# Patient Record
Sex: Female | Born: 1990 | Race: Asian | Hispanic: No | Marital: Single | Smoking: Never smoker
Health system: Southern US, Community
[De-identification: ages and names within clinical notes are randomized; demographics above are authoritative.]

## PROBLEM LIST (undated history)

## (undated) ENCOUNTER — Inpatient Hospital Stay (HOSPITAL_COMMUNITY): Payer: Self-pay

## (undated) DIAGNOSIS — Z9289 Personal history of other medical treatment: Secondary | ICD-10-CM

## (undated) HISTORY — PX: WISDOM TOOTH EXTRACTION: SHX21

---
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
## 2011-08-21 ENCOUNTER — Encounter (INDEPENDENT_AMBULATORY_CARE_PROVIDER_SITE_OTHER): Payer: Medicaid Other

## 2011-08-21 DIAGNOSIS — Z331 Pregnant state, incidental: Secondary | ICD-10-CM

## 2011-08-26 LAB — HEPATITIS B SURFACE ANTIGEN: Hepatitis B Surface Ag: NEGATIVE

## 2011-08-26 LAB — ABO/RH: RH Type: POSITIVE

## 2011-08-26 LAB — RUBELLA ANTIBODY, IGM: Rubella: IMMUNE

## 2011-08-26 LAB — ANTIBODY SCREEN: Antibody Screen: NEGATIVE

## 2011-08-26 LAB — HIV ANTIBODY (ROUTINE TESTING W REFLEX): HIV: NONREACTIVE

## 2011-09-02 ENCOUNTER — Encounter: Payer: Medicaid Other | Admitting: Obstetrics and Gynecology

## 2011-09-02 ENCOUNTER — Encounter (INDEPENDENT_AMBULATORY_CARE_PROVIDER_SITE_OTHER): Payer: Medicaid Other

## 2011-09-02 DIAGNOSIS — O0930 Supervision of pregnancy with insufficient antenatal care, unspecified trimester: Secondary | ICD-10-CM

## 2011-09-02 DIAGNOSIS — Z331 Pregnant state, incidental: Secondary | ICD-10-CM

## 2011-09-02 DIAGNOSIS — N898 Other specified noninflammatory disorders of vagina: Secondary | ICD-10-CM

## 2011-09-02 DIAGNOSIS — O26849 Uterine size-date discrepancy, unspecified trimester: Secondary | ICD-10-CM

## 2011-09-12 ENCOUNTER — Encounter (INDEPENDENT_AMBULATORY_CARE_PROVIDER_SITE_OTHER): Payer: Medicaid Other | Admitting: Obstetrics and Gynecology

## 2011-09-12 ENCOUNTER — Other Ambulatory Visit: Payer: Medicaid Other

## 2011-09-12 ENCOUNTER — Other Ambulatory Visit: Payer: Self-pay | Admitting: Obstetrics and Gynecology

## 2011-09-12 DIAGNOSIS — Z331 Pregnant state, incidental: Secondary | ICD-10-CM

## 2011-09-12 DIAGNOSIS — D280 Benign neoplasm of vulva: Secondary | ICD-10-CM

## 2011-09-12 DIAGNOSIS — N72 Inflammatory disease of cervix uteri: Secondary | ICD-10-CM

## 2011-09-12 DIAGNOSIS — O0930 Supervision of pregnancy with insufficient antenatal care, unspecified trimester: Secondary | ICD-10-CM

## 2011-09-13 ENCOUNTER — Inpatient Hospital Stay (HOSPITAL_COMMUNITY)
Admission: AD | Admit: 2011-09-13 | Discharge: 2011-09-16 | DRG: 775 | Disposition: A | Discharge status: Home / Self Care | Payer: Medicaid Other | Source: Ambulatory Visit | Attending: Obstetrics and Gynecology | Admitting: Obstetrics and Gynecology

## 2011-09-13 ENCOUNTER — Encounter (HOSPITAL_COMMUNITY): Payer: Self-pay | Admitting: *Deleted

## 2011-09-13 DIAGNOSIS — Z3400 Encounter for supervision of normal first pregnancy, unspecified trimester: Secondary | ICD-10-CM

## 2011-09-13 DIAGNOSIS — D649 Anemia, unspecified: Secondary | ICD-10-CM | POA: Diagnosis not present

## 2011-09-13 LAB — URINALYSIS, ROUTINE W REFLEX MICROSCOPIC
Glucose, UA: NEGATIVE mg/dL
Ketones, ur: >80 mg/dL — AB
Nitrite: NEGATIVE
Protein, ur: 30 mg/dL — AB
Specific Gravity, Urine: 1.025 (ref 1.005–1.030)
Urobilinogen, UA: 1 mg/dL (ref 0.0–1.0)
pH: 6.5 (ref 5.0–8.0)

## 2011-09-13 LAB — CBC
Hemoglobin: 13 g/dL (ref 12.0–15.0)
MCHC: 33.4 g/dL (ref 30.0–36.0)
RBC: 4.61 MIL/uL (ref 3.87–5.11)
WBC: 18.9 10*3/uL — ABNORMAL HIGH (ref 4.0–10.5)

## 2011-09-13 LAB — URINE MICROSCOPIC-ADD ON

## 2011-09-13 MED ORDER — FLEET ENEMA 7-19 GM/118ML RE ENEM: 1.0000 | ENEMA | RECTAL | Status: DC | PRN

## 2011-09-13 MED ORDER — OXYTOCIN 20 UNITS IN LACTATED RINGERS INFUSION - SIMPLE
125.0000 mL/h | Freq: Once | INTRAVENOUS | Status: AC
  Administered 2011-09-14: 125 mL/h via INTRAVENOUS

## 2011-09-13 MED ORDER — LACTATED RINGERS IV SOLN
500.0000 mL | INTRAVENOUS | Status: DC | PRN
  Administered 2011-09-14: 500 mL via INTRAVENOUS

## 2011-09-13 MED ORDER — OXYTOCIN BOLUS FROM INFUSION
500.0000 mL | Freq: Once | INTRAVENOUS | Status: DC
  Filled 2011-09-13: qty 1000
  Filled 2011-09-13: qty 500

## 2011-09-13 MED ORDER — BUTORPHANOL TARTRATE 2 MG/ML IJ SOLN
2.0000 mg | INTRAMUSCULAR | Status: DC | PRN
  Administered 2011-09-13 – 2011-09-14 (×3): 2 mg via INTRAVENOUS
  Filled 2011-09-13 (×3): qty 1

## 2011-09-13 MED ORDER — ONDANSETRON HCL 4 MG/2ML IJ SOLN: 4.0000 mg | Freq: Four times a day (QID) | INTRAMUSCULAR | Status: DC | PRN

## 2011-09-13 MED ORDER — PENICILLIN G POTASSIUM 5000000 UNITS IJ SOLR
5.0000 10*6.[IU] | Freq: Once | INTRAVENOUS | Status: AC
  Administered 2011-09-13: 5 10*6.[IU] via INTRAVENOUS
  Filled 2011-09-13: qty 5

## 2011-09-13 MED ORDER — LIDOCAINE HCL (PF) 1 % IJ SOLN
30.0000 mL | INTRAMUSCULAR | Status: DC | PRN
  Administered 2011-09-14: 30 mL via SUBCUTANEOUS
  Filled 2011-09-13: qty 30

## 2011-09-13 MED ORDER — ACETAMINOPHEN 325 MG PO TABS: 650.0000 mg | ORAL_TABLET | ORAL | Status: DC | PRN

## 2011-09-13 MED ORDER — OXYCODONE-ACETAMINOPHEN 5-325 MG PO TABS: 1.0000 | ORAL_TABLET | ORAL | Status: DC | PRN

## 2011-09-13 MED ORDER — IBUPROFEN 600 MG PO TABS: 600.0000 mg | ORAL_TABLET | Freq: Four times a day (QID) | ORAL | Status: DC | PRN

## 2011-09-13 MED ORDER — LACTATED RINGERS IV SOLN
INTRAVENOUS | Status: DC
  Administered 2011-09-13 – 2011-09-14 (×2): via INTRAVENOUS

## 2011-09-13 MED ORDER — PENICILLIN G POTASSIUM 5000000 UNITS IJ SOLR
2.5000 10*6.[IU] | INTRAVENOUS | Status: DC
  Administered 2011-09-14 (×2): 2.5 10*6.[IU] via INTRAVENOUS
  Filled 2011-09-13 (×7): qty 2.5

## 2011-09-13 MED ORDER — CITRIC ACID-SODIUM CITRATE 334-500 MG/5ML PO SOLN: 30.0000 mL | ORAL | Status: DC | PRN

## 2011-09-13 NOTE — MAU Note (Signed)
Pt states has been contracting for last 24 hours and it has gotten worse and has a little spotting sometimes when she wipes

## 2011-09-13 NOTE — H&P (Signed)
Deborah Craig is a 21 y.o. female presenting for c/o of "contractions since 0400 stronger at 1500 feels like cramping some red mucus no water no drippy bleeding, with +FM, no problems with pg no "cultures yet for GBS, [place] was normal will repeat next week, only gained 18 # with the pregnancy" Pg significant for: no prenatal records found in computer denies complications. Started care at 33 weeks per 1st prenatal lab work as no chart available and pt uncertain, had no prior [facility]. Pg significant for: Late PNC 33 4/7 weeks History OB History    Grav Para Term Preterm Abortions TAB SAB Ect Mult Living   1              History reviewed. No pertinent past medical history. No past surgical history on file. Family History: family history includes Diabetes in her mother and Hypertension in her mother. Social History:  reports that she has never smoked. She has never used smokeless tobacco. She reports that she does not drink alcohol or use illicit drugs.  ROS No surgery, No med problems NKA Meds: PNV Dilation: 3 Effacement (%): 100 Station: 0 Exam by:: Lavera Guise CNM Blood pressure 119/66, pulse 100, resp. rate 20. Exam Physical Exam calm quiet, no distress, lungs clear AP RRR, abd soft gravid, Bowel sounds active,  nt, no edema lower legs, EGBUS WNL fundal height 35 cm. Prenatal labs: ABO, Rh:  O pos Antibody:  neg Rubella:  Immune RPR:   NR HBsAg:   Neg HIV:   NR GBS:   pending at [facility] GC/CHL pending at [facility]  Assessment/Plan: 36 5/7 week IUP P wil reassess vag for cervical change, po fluids now, GBS, GC/CHL urine and ua to lab. Will admit now after observation collaboration with Dr. Pennie Rushing. Deborah Craig 09/13/2011, 7:42 PM

## 2011-09-13 NOTE — Progress Notes (Signed)
GBS obtained. Pt tol well.

## 2011-09-14 ENCOUNTER — Encounter (HOSPITAL_COMMUNITY): Payer: Self-pay | Admitting: *Deleted

## 2011-09-14 DIAGNOSIS — Z3400 Encounter for supervision of normal first pregnancy, unspecified trimester: Secondary | ICD-10-CM

## 2011-09-14 LAB — RPR: RPR Ser Ql: NONREACTIVE

## 2011-09-14 MED ORDER — PHENYLEPHRINE 40 MCG/ML (10ML) SYRINGE FOR IV PUSH (FOR BLOOD PRESSURE SUPPORT): 80.0000 ug | PREFILLED_SYRINGE | INTRAVENOUS | Status: DC | PRN

## 2011-09-14 MED ORDER — ONDANSETRON HCL 4 MG PO TABS: 4.0000 mg | ORAL_TABLET | ORAL | Status: DC | PRN

## 2011-09-14 MED ORDER — LANOLIN HYDROUS EX OINT: TOPICAL_OINTMENT | CUTANEOUS | Status: DC | PRN

## 2011-09-14 MED ORDER — ONDANSETRON HCL 4 MG/2ML IJ SOLN: 4.0000 mg | INTRAMUSCULAR | Status: DC | PRN

## 2011-09-14 MED ORDER — OXYCODONE-ACETAMINOPHEN 5-325 MG PO TABS: 1.0000 | ORAL_TABLET | ORAL | Status: DC | PRN

## 2011-09-14 MED ORDER — PRENATAL MULTIVITAMIN CH
1.0000 | ORAL_TABLET | Freq: Every day | ORAL | Status: DC
  Filled 2011-09-14: qty 1

## 2011-09-14 MED ORDER — ZOLPIDEM TARTRATE 5 MG PO TABS: 5.0000 mg | ORAL_TABLET | Freq: Every evening | ORAL | Status: DC | PRN

## 2011-09-14 MED ORDER — IBUPROFEN 600 MG PO TABS
600.0000 mg | ORAL_TABLET | Freq: Four times a day (QID) | ORAL | Status: DC
  Administered 2011-09-14: 600 mg via ORAL
  Filled 2011-09-14: qty 1

## 2011-09-14 MED ORDER — PHENYLEPHRINE 40 MCG/ML (10ML) SYRINGE FOR IV PUSH (FOR BLOOD PRESSURE SUPPORT)
80.0000 ug | PREFILLED_SYRINGE | INTRAVENOUS | Status: DC | PRN
  Filled 2011-09-14: qty 5

## 2011-09-14 MED ORDER — IBUPROFEN 600 MG PO TABS
600.0000 mg | ORAL_TABLET | Freq: Four times a day (QID) | ORAL | Status: DC
  Administered 2011-09-14 – 2011-09-16 (×6): 600 mg via ORAL
  Filled 2011-09-14 (×5): qty 1

## 2011-09-14 MED ORDER — DIBUCAINE 1 % RE OINT: 1.0000 "application " | TOPICAL_OINTMENT | RECTAL | Status: DC | PRN

## 2011-09-14 MED ORDER — BENZOCAINE-MENTHOL 20-0.5 % EX AERO: 1.0000 "application " | INHALATION_SPRAY | CUTANEOUS | Status: DC | PRN

## 2011-09-14 MED ORDER — DIPHENHYDRAMINE HCL 25 MG PO CAPS: 25.0000 mg | ORAL_CAPSULE | Freq: Four times a day (QID) | ORAL | Status: DC | PRN

## 2011-09-14 MED ORDER — LACTATED RINGERS IV SOLN: INTRAVENOUS | Status: DC

## 2011-09-14 MED ORDER — EPHEDRINE 5 MG/ML INJ: 10.0000 mg | INTRAVENOUS | Status: DC | PRN

## 2011-09-14 MED ORDER — WITCH HAZEL-GLYCERIN EX PADS: 1.0000 "application " | MEDICATED_PAD | CUTANEOUS | Status: DC | PRN

## 2011-09-14 MED ORDER — PRENATAL MULTIVITAMIN CH
1.0000 | ORAL_TABLET | Freq: Every day | ORAL | Status: DC
  Administered 2011-09-15 – 2011-09-16 (×2): 1 via ORAL
  Filled 2011-09-14: qty 1

## 2011-09-14 MED ORDER — BENZOCAINE-MENTHOL 20-0.5 % EX AERO
INHALATION_SPRAY | CUTANEOUS | Status: AC
  Filled 2011-09-14: qty 56

## 2011-09-14 MED ORDER — SENNOSIDES-DOCUSATE SODIUM 8.6-50 MG PO TABS
2.0000 | ORAL_TABLET | Freq: Every day | ORAL | Status: DC
  Administered 2011-09-14 – 2011-09-15 (×2): 2 via ORAL

## 2011-09-14 MED ORDER — TETANUS-DIPHTH-ACELL PERTUSSIS 5-2.5-18.5 LF-MCG/0.5 IM SUSP
0.5000 mL | Freq: Once | INTRAMUSCULAR | Status: AC
  Administered 2011-09-15: 0.5 mL via INTRAMUSCULAR
  Filled 2011-09-14: qty 0.5

## 2011-09-14 MED ORDER — DIPHENHYDRAMINE HCL 50 MG/ML IJ SOLN: 12.5000 mg | INTRAMUSCULAR | Status: DC | PRN

## 2011-09-14 MED ORDER — LACTATED RINGERS IV SOLN: 500.0000 mL | Freq: Once | INTRAVENOUS | Status: DC

## 2011-09-14 MED ORDER — BENZOCAINE-MENTHOL 20-0.5 % EX AERO
1.0000 "application " | INHALATION_SPRAY | CUTANEOUS | Status: DC | PRN
  Administered 2011-09-14: 1 via TOPICAL

## 2011-09-14 MED ORDER — SENNOSIDES-DOCUSATE SODIUM 8.6-50 MG PO TABS: 2.0000 | ORAL_TABLET | Freq: Every day | ORAL | Status: DC

## 2011-09-14 MED ORDER — SIMETHICONE 80 MG PO CHEW: 80.0000 mg | CHEWABLE_TABLET | ORAL | Status: DC | PRN

## 2011-09-14 MED ORDER — EPHEDRINE 5 MG/ML INJ
10.0000 mg | INTRAVENOUS | Status: DC | PRN
  Filled 2011-09-14: qty 4

## 2011-09-14 MED ORDER — FENTANYL 2.5 MCG/ML BUPIVACAINE 1/10 % EPIDURAL INFUSION (WH - ANES)
14.0000 mL/h | INTRAMUSCULAR | Status: DC
  Filled 2011-09-14: qty 60

## 2011-09-14 MED ORDER — MEDROXYPROGESTERONE ACETATE 150 MG/ML IM SUSP: 150.0000 mg | INTRAMUSCULAR | Status: DC | PRN

## 2011-09-14 MED ORDER — TETANUS-DIPHTH-ACELL PERTUSSIS 5-2.5-18.5 LF-MCG/0.5 IM SUSP: 0.5000 mL | Freq: Once | INTRAMUSCULAR | Status: DC

## 2011-09-14 NOTE — Progress Notes (Signed)
Subjective: RN called me to bedside to assess pt secondary to increased bloody show, and pain medicine wearing off and pt appearing more uncomfortable.  Objective: BP 107/64  Pulse 81  Temp(Src) 98.3 F (36.8 C) (Oral)  Resp 18  Ht 5\' 4"  (1.626 m)  Wt 67.132 kg (148 lb)  BMI 25.40 kg/m2      FHT:  FHR: 140 bpm, variability: minimal ,  accelerations:  Present,  decelerations:  Absent UC:   irregular, every 3-5 minutes SVE:   Dilation: 6 Effacement (%): 100 Station: 0 Exam by:: h Sharlyne Koeneman,cnm Small amt of show;  Labs: Lab Results  Component Value Date   WBC 18.9* 09/13/2011   HGB 13.0 09/13/2011   HCT 38.9 09/13/2011   MCV 84.4 09/13/2011   PLT 327 09/13/2011    Assessment / Plan: 1.  36.6  2.  early active labor  3.  GBS unknown, but receiving PCN  Labor: early active Preeclampsia:  no signs or symptoms of toxicity Fetal Wellbeing:  Category I Pain Control:  has received 2 doses of IV stadol I/D:  n/a Anticipated MOD:  NSVD 1.  Continue current POC  2.  Support as needed  3. C/w MD prn Elie Leppo H 09/14/2011, 3:52 AM

## 2011-09-14 NOTE — Progress Notes (Signed)
Subjective: Pt resting on her Lt side and her mother is at bedside resting. Pt received 2mg  Stadol per MAR at 2140, and RN reports pt able to sleep for the most part.  Pt verbally states at present that her ctxs are the same frequency and intensity since admitted to birthing suites.  Objective: BP 137/82  Pulse 81  Temp(Src) 98.5 F (36.9 C) (Oral)  Resp 16  Ht 5\' 4"  (1.626 m)  Wt 67.132 kg (148 lb)  BMI 25.40 kg/m2      FHT:  FHR: 140 bpm, variability: moderate,  accelerations:  Present,  decelerations:  Absent UC:   irregular, every 2-8 minutes SVE:   Dilation: 4.5 Effacement (%): 100 Station: -1;0 Exam by:: [employer]CNM Gen:  Pt in NAD, no labored breathing or grimace w/ ctxs Labs: Lab Results  Component Value Date   WBC 18.9* 09/13/2011   HGB 13.0 09/13/2011   HCT 38.9 09/13/2011   MCV 84.4 09/13/2011   PLT 327 09/13/2011    Assessment / Plan: 1.  36.6  2. irregular ctx pattern  3.  early vs prodromal labor  4.  GBS sent, but receiving PCN at present  Preeclampsia:  no signs or symptoms of toxicity Fetal Wellbeing:  Category I Pain Control:  received Stadol x1 I/D:  n/a Anticipated MOD:  NSVD 1.  Will CTO for spontaneous labor progression as the night proceeds  2.  Do not plan to augment secondary to preterm gestation  3.  C/w MD prn. Deborah Craig H 09/14/2011, 12:26 AM

## 2011-09-14 NOTE — Progress Notes (Signed)
Patient ID: Italia Wolfert, female   DOB: Dec 10, 1990, 20 y.o.   MRN: [id number] Delivery Note Called to see pt due to involuntary pushing at 09:45am.  SVE C/C/+3.  Pt pushed effectively with UCs.   At 10:02 AM a viable female was delivered via Vaginal, Spontaneous Delivery (Presentation: ; Occiput Anterior).  APGAR: 9, 9; weight 6 lb 8.2 oz (2954 g).   Placenta status: Intact, Spontaneous.  Cord: 3 vessels with the following complications: None.  Cord pH: N/A  Anesthesia: None  Episiotomy: None Lacerations: 2nd degree;Perineal Suture Repair: 3.0 vicryl rapide Est. Blood Loss (mL): 350  Mom to postpartum.  Baby to nursery-stable.  Eleaner Dibartolo O. 09/14/2011, 2:30 PM

## 2011-09-14 NOTE — Progress Notes (Signed)
Deborah Craig is a 21 y.o. G1P0 at [redacted]w[redacted]d admitted for active labor  Subjective: Pt recvd Stadol approx 1hr ago and states discomfort has improved at present.  Pt dozing between contractions and quiet in bed.    Objective: BP 143/81  Pulse 88  Temp(Src) 97.5 F (36.4 C) (Oral)  Resp 20  Ht 5\' 4"  (1.626 m)  Wt 67.132 kg (148 lb)  BMI 25.40 kg/m2     FHT:  FHR: 155 bpm, variability: moderate,  accelerations:  Present,  decelerations:  Absent UC:   regular, every 3-4 minutes SVE:   Dilation: 8 Effacement (%): 100 Station: 0 Exam by:: Jakaila Norment cnm AROM performed with mod clear fluid obtained.   Labs: Lab Results  Component Value Date   WBC 18.9* 09/13/2011   HGB 13.0 09/13/2011   HCT 38.9 09/13/2011   MCV 84.4 09/13/2011   PLT 327 09/13/2011    Assessment / Plan: Spontaneous labor, progressing normally  Labor: Progressing normally Preeclampsia:  no signs or symptoms of toxicity Fetal Wellbeing:  Category I Pain Control:  Labor support without medications I/D:  n/a Anticipated MOD:  NSVD  Continue current plan of care.    Roger Fasnacht O. 09/14/2011, 8:41 AM

## 2011-09-14 NOTE — Progress Notes (Signed)
Subjective: Pt requesting additional pain medicine now.    Objective: BP 114/70  Pulse 92  Temp(Src) 98.3 F (36.8 C) (Oral)  Resp 18  Ht 5\' 4"  (1.626 m)  Wt 67.132 kg (148 lb)  BMI 25.40 kg/m2      FHT:  FHR: 140 bpm, variability: moderate,  accelerations:  Present,  decelerations:  Absent UC:   irregular, every 2-6 minutes SVE:   Dilation: 5.5 Effacement (%): 100 Station: -1 Exam by:: H. Ronnell Clinger, CNM Small amt of bloody show on pt's gown, perineum, and pillow-case which was between her legs cx primarily on pt's Rt; membranes bulging Labs: Lab Results  Component Value Date   WBC 18.9* 09/13/2011   HGB 13.0 09/13/2011   HCT 38.9 09/13/2011   MCV 84.4 09/13/2011   PLT 327 09/13/2011    Assessment / Plan: 1.  36.6  2.  cervical change with irregular ctxs  3.  spontaneous labor  4.  due 2nd dose of PCN now  Preeclampsia:  no signs or symptoms of toxicity Fetal Wellbeing:  Category I Pain Control:  receiving 2nd dose of stadol now I/D:  n/a Anticipated MOD:  NSVD 1.  Support as needed  2.  CTO for labor progression and c/w MD if labor doesn't continue to progress Ayad Nieman H 09/14/2011, 2:07 AM

## 2011-09-15 DIAGNOSIS — D649 Anemia, unspecified: Secondary | ICD-10-CM | POA: Diagnosis not present

## 2011-09-15 LAB — DIFFERENTIAL
Basophils Absolute: 0 10*3/uL (ref 0.0–0.1)
Basophils Relative: 0 % (ref 0–1)
Eosinophils Absolute: 0.2 10*3/uL (ref 0.0–0.7)
Lymphocytes Relative: 19 % (ref 12–46)
Lymphs Abs: 3.5 10*3/uL (ref 0.7–4.0)
Monocytes Absolute: 2 10*3/uL — ABNORMAL HIGH (ref 0.1–1.0)
Neutro Abs: 12.6 10*3/uL — ABNORMAL HIGH (ref 1.7–7.7)

## 2011-09-15 LAB — CBC
HCT: 31.7 % — ABNORMAL LOW (ref 36.0–46.0)
Hemoglobin: 10.3 g/dL — ABNORMAL LOW (ref 12.0–15.0)
MCH: 27.7 pg (ref 26.0–34.0)
MCHC: 32.2 g/dL (ref 30.0–36.0)
MCV: 86.1 fL (ref 78.0–100.0)
RDW: 16.4 % — ABNORMAL HIGH (ref 11.5–15.5)

## 2011-09-15 NOTE — Progress Notes (Signed)
PSYCHOSOCIAL ASSESSMENT ~ MATERNAL/CHILD Name:  Deborah Craig        Age: 21 years    Referral Date: 09/14/2011   Reason/Source:Late PNC/CN I. FAMILY/HOME ENVIRONMENT A. Child's Legal Guardian Parent(s)     Name: Ruqayyah Lute DOB: 1990-08-14   Age: 58 Address: [address], [place], [place] [address]  B. Other Household Members/Support Persons   Maternal grandmother   Maternal aunts/uncles                     C.   Other Support: Extended family and friends  II. PSYCHOSOCIAL DATA A. Information Source X Patient Interview X Family Interview           B. Event organiser X Medicaid-[employer]- will apply      X WIC   C. Cultural and Environment Information/Cultural Issues Impacting Care: N/A III. STRENGTHS X Supportive family/friends   X Adequate Resources  X Compliance with medical plan  X Home prepared for Child (including basic supplies)                 X Other:  Extended family support IV. RISK FACTORS AND CURRENT PROBLEMS        X No Problems Noted                        V. SOCIAL WORK ASSESSMENT Met with MOB and baby at bedside.  Maternal grandmother and aunt were present with mom's permission.  MOB was late to prenatal care due to Medicaid not becoming active until later in pregnancy.  MOB received some WIC services in [place] [facility] due to the [place] office reportedly having been full.  MOB is the second oldest of five children and her siblings are excited to meet the new baby.  MOB has lots of extended family support and good role models for breastfeeding.  She is not working and is supported by her family. She aspires to eventually return to [employer] to pursue nursing.  MOB reports recovery well and feels good about her ability to parent her newborn.  She expressed no concerns or needs at this time.  VI. SOCIAL WORK PLAN X No Further Intervention Required/No Barriers to Discharge  Staci Acosta, LCSW 09/15/2011, 5:10  pm

## 2011-09-15 NOTE — Progress Notes (Signed)
Post Partum Day 1  Subjective: no complaints, up ad lib, voiding and tolerating PO  Objective: Blood pressure 91/49, pulse 79, temperature 98 F (36.7 C), temperature source Oral, resp. rate 18, height 5\' 4"  (1.626 m), weight 67.132 kg (148 lb), SpO2 98.00%, unknown if currently breastfeeding.  Physical Exam:  General: alert, cooperative and no distress Lochia: appropriate Uterine Fundus: firm Incision: NA DVT Evaluation: Negative Homan's sign. No significant calf/ankle edema.   Basename 09/15/11 0530 09/13/11 2125  HGB 10.3* 13.0  HCT 31.7* 38.9    Assessment/Plan: Plan for discharge tomorrow, Breastfeeding and Contraception Depo   LOS: 2 days   Dorathy Kinsman 09/15/2011, 9:07 AM

## 2011-09-16 LAB — CULTURE, BETA STREP (GROUP B ONLY)

## 2011-09-16 LAB — CBC
Hemoglobin: 10.3 g/dL — ABNORMAL LOW (ref 12.0–15.0)
MCH: 27.3 pg (ref 26.0–34.0)
MCHC: 31.6 g/dL (ref 30.0–36.0)
RDW: 16.4 % — ABNORMAL HIGH (ref 11.5–15.5)

## 2011-09-16 LAB — GC/CHLAMYDIA PROBE AMP, URINE
Chlamydia, Swab/Urine, PCR: POSITIVE — AB
GC Probe Amp, Urine: NEGATIVE

## 2011-09-16 LAB — HERPES SIMPLEX VIRUS CULTURE: Organism ID, Bacteria: NOT DETECTED

## 2011-09-16 MED ORDER — AZITHROMYCIN 500 MG PO TABS
1000.0000 mg | ORAL_TABLET | Freq: Once | ORAL | Status: AC
  Administered 2011-09-16: 1000 mg via ORAL
  Filled 2011-09-16: qty 2

## 2011-09-16 MED ORDER — IBUPROFEN 600 MG PO TABS: 600.0000 mg | ORAL_TABLET | Freq: Four times a day (QID) | ORAL | Status: AC

## 2011-09-16 MED ORDER — MEDROXYPROGESTERONE ACETATE 150 MG/ML IM SUSP
150.0000 mg | Freq: Once | INTRAMUSCULAR | Status: AC
  Administered 2011-09-16: 150 mg via INTRAMUSCULAR
  Filled 2011-09-16: qty 1

## 2011-09-16 NOTE — Discharge Summary (Addendum)
Physician Discharge Summary  Patient ID: Deborah Craig MRN: [id number] DOB/AGE: 08-14-1990 21 y.o.  Admit date: 09/13/2011 Discharge date: 09/16/2011   Obstetric Discharge Summary Reason for Admission: onset of labor Prenatal Procedures: ultrasound Intrapartum Procedures: spontaneous vaginal delivery Postpartum Procedures: none Complications-Operative and Postpartum: 2nd degree perineal laceration  Temp:  [97.9 F (36.6 C)-98.1 F (36.7 C)] 98 F (36.7 C) (04/08 0525) Pulse Rate:  [76-80] 76  (04/08 0525) Resp:  [18-20] 18  (04/08 0525) BP: (91-98)/(42-61) 98/61 mmHg (04/08 0525) Hemoglobin  Date Value Range Status  09/16/2011 10.3* 12.0-15.0 (g/dL) Final     HCT  Date Value Range Status  09/16/2011 32.6* 36.0-46.0 (%) Final    Hospital Course:  Hospital Course: Admitted in labor. Unknown GBS. Progressed to fully dilated. Delivery was performed by Elsie Ra, CNM without difficulty. Patient and baby tolerated the procedure without difficulty, with a 2nd degree laceration noted. Infant to FTN. Mother and infant then had an uncomplicated postpartum course, with breastfeeding going well. Mom's physical exam was WNL, and she was discharged home in stable condition. Contraception plan was depo provera.  She received adequate benefit from po pain medications.  Discharge Diagnoses: Term Pregnancy-delivered  Discharge Information: Date: 09/16/2011 Activity: nothing in vagina x 6 weeks Diet: routine Medications:  Medication List  As of 09/16/2011  9:06 AM   START taking these medications         ibuprofen 600 MG tablet   Commonly known as: ADVIL,MOTRIN   Take 1 tablet (600 mg total) by mouth every 6 (six) hours.         CONTINUE taking these medications         prenatal multivitamin Tabs          Where to get your medications    These are the prescriptions that you need to pick up.   You may get these medications from any pharmacy.         ibuprofen 600 MG tablet            Condition: stable Instructions: refer to practice specific booklet Discharge to: home Follow-up Information    Follow up with [facility]. Call in 6 weeks.         Newborn Data: Live born  Information for the patient's newborn:  Moro, Girl Abella [[id number]]  female ; APGAR 9,9 ; weight 6lbs. 8.2 oz.    Kizzie Fantasia CORI 09/16/2011, 9:06 AM      Signed: Kizzie Fantasia CORI 09/16/2011, 9:05 AM  Addendum +chlamydia discussed needs test of cure at pp visit. Lavera Guise, CNM

## 2011-09-16 NOTE — Discharge Instructions (Signed)
Vaginal Delivery Care After  Change your pad on each trip to the bathroom.   Wipe gently with toilet paper during your hospital stay. Always wipe from front to back. A spray bottle with warm tap water could also be used or a towelette if available.   Place your soiled pad and toilet paper in a bathroom wastebasket with a plastic bag liner.   During your hospital stay, save any clots. If you pass a clot while on the toilet, do not flush it. Also, if your vaginal flow seems excessive to you, notify nursing personnel.   The first time you get out of bed after delivery, wait for assistance from a nurse. Do not get up alone at any time if you feel weak or dizzy.   Bend and extend your ankles forcefully so that you feel the calves of your legs get hard. Do this 6 times every hour when you are in bed and awake.   Do not sit with one foot under you, dangle your legs over the edge of the bed, or maintain a position that hinders the circulation in your legs.   Many women experience after pains for 2 to 3 days after delivery. These after pains are mild uterine contractions. Ask the nurse for a pain medication if you need something for this. Sometimes breastfeeding stimulates after pains; if you find this to be true, ask for the medication  -  hour before the next feeding.   For you and your infant's protection, do not go beyond the door(s) of the obstetric unit. Do not carry your baby in your arms in the hallway. When taking your baby to and from your room, put your baby in the bassinet and push the bassinet.   Mothers may have their babies in their room as much as they desire.   Breastfeeding BENEFITS OF BREASTFEEDING For the baby  The first milk (colostrum) helps the baby's digestive system function better.   There are antibodies from the mother in the milk that help the baby fight off infections.   The baby has a lower incidence of asthma, allergies, and SIDS (sudden infant death syndrome).     The nutrients in breast milk are better than formulas for the baby and helps the baby's brain grow better.   Babies who breastfeed have less gas, colic, and constipation.  For the mother  Breastfeeding helps develop a very special bond between mother and baby.   It is more convenient, always available at the correct temperature and cheaper than formula feeding.   It burns calories in the mother and helps with losing weight that was gained during pregnancy.   It makes the uterus contract back down to normal size faster and slows bleeding following delivery.   Breastfeeding mothers have a lower risk of developing breast cancer.  NURSE FREQUENTLY  A healthy, full-term baby may breastfeed as often as every hour or space his or her feedings to every 3 hours.   How often to nurse will vary from baby to baby. Watch your baby for signs of hunger, not the clock.   Nurse as often as the baby requests, or when you feel the need to reduce the fullness of your breasts.   Awaken the baby if it has been 3 to 4 hours since the last feeding.   Frequent feeding will help the mother make more milk and will prevent problems like sore nipples and engorgement of the breasts.  BABY'S POSITION AT THE BREAST    Whether lying down or sitting, be sure that the baby's tummy is facing your tummy.   Support the breast with 4 fingers underneath the breast and the thumb above. Make sure your fingers are well away from the nipple and baby's mouth.   Stroke the baby's lips and cheek closest to the breast gently with your finger or nipple.   When the baby's mouth is open wide enough, place all of your nipple and as much of the dark area around the nipple as possible into your baby's mouth.   Pull the baby in close so the tip of the nose and the baby's cheeks touch the breast during the feeding.  FEEDINGS  The length of each feeding varies from baby to baby and from feeding to feeding.   The baby must suck  about 2 to 3 minutes for your milk to get to him or her. This is called a "let down." For this reason, allow the baby to feed on each breast as long as he or she wants. Your baby will end the feeding when he or she has received the right balance of nutrients.   To break the suction, put your finger into the corner of the baby's mouth and slide it between his or her gums before removing your breast from his or her mouth. This will help prevent sore nipples.  REDUCING BREAST ENGORGEMENT  In the first week after your baby is born, you may experience signs of breast engorgement. When breasts are engorged, they feel heavy, warm, full, and may be tender to the touch. You can reduce engorgement if you:   Nurse frequently, every 2 to 3 hours. Mothers who breastfeed early and often have fewer problems with engorgement.   Place light ice packs on your breasts between feedings. This reduces swelling. Wrap the ice packs in a lightweight towel to protect your skin.   Apply moist hot packs to your breast for 5 to 10 minutes before each feeding. This increases circulation and helps the milk flow.   Gently massage your breast before and during the feeding.   Make sure that the baby empties at least one breast at every feeding before switching sides.   Use a breast pump to empty the breasts if your baby is sleepy or not nursing well. You may also want to pump if you are returning to work or or you feel you are getting engorged.   Avoid bottle feeds, pacifiers or supplemental feedings of water or juice in place of breastfeeding.   Be sure the baby is latched on and positioned properly while breastfeeding.   Prevent fatigue, stress, and anemia.   Wear a supportive bra, avoiding underwire styles.   Eat a balanced diet with enough fluids.  If you follow these suggestions, your engorgement should improve in 24 to 48 hours. If you are still experiencing difficulty, call your lactation consultant or  caregiver. IS MY BABY GETTING ENOUGH MILK? Sometimes, mothers worry about whether their babies are getting enough milk. You can be assured that your baby is getting enough milk if:  The baby is actively sucking and you hear swallowing.   The baby nurses at least 8 to 12 times in a 24 hour time period. Nurse your baby until he or she unlatches or falls asleep at the first breast (at least 10 to 20 minutes), then offer the second side.   The baby is wetting 5 to 6 disposable diapers (6 to 8 cloth diapers) in a   24 hour period by 5 to 6 days of age.   The baby is having at least 2 to 3 stools every 24 hours for the first few months. Breast milk is all the food your baby needs. It is not necessary for your baby to have water or formula. In fact, to help your breasts make more milk, it is best not to give your baby supplemental feedings during the early weeks.   The stool should be soft and yellow.   The baby should gain 4 to 7 ounces per week after he is 4 days old.  TAKE CARE OF YOURSELF Take care of your breasts by:  Bathing or showering daily.   Avoiding the use of soaps on your nipples.   Start feedings on your left breast at one feeding and on your right breast at the next feeding.   You will notice an increase in your milk supply 2 to 5 days after delivery. You may feel some discomfort from engorgement, which makes your breasts very firm and often tender. Engorgement "peaks" out within 24 to 48 hours. In the meantime, apply warm moist towels to your breasts for 5 to 10 minutes before feeding. Gentle massage and expression of some milk before feeding will soften your breasts, making it easier for your baby to latch on. Wear a well fitting nursing bra and air dry your nipples for 10 to 15 minutes after each feeding.   Only use cotton bra pads.   Only use pure lanolin on your nipples after nursing. You do not need to wash it off before nursing.  Take care of yourself by:   Eating  well-balanced meals and nutritious snacks.   Drinking milk, fruit juice, and water to satisfy your thirst (about 8 glasses a day).   Getting plenty of rest.   Increasing calcium in your diet (1200 mg a day).   Avoiding foods that you notice affect the baby in a bad way.  SEEK MEDICAL CARE IF:   You have any questions or difficulty with breastfeeding.   You need help.   You have a hard, red, sore area on your breast, accompanied by a fever of 100.5 F (38.1 C) or more.   Your baby is too sleepy to eat well or is having trouble sleeping.   Your baby is wetting less than 6 diapers per day, by 5 days of age.   Your baby's skin or white part of his or her eyes is more yellow than it was in the hospital.   You feel depressed.  Document Released: 05/27/2005 Document Revised: 05/16/2011 Document Reviewed: 01/09/2009 [employer] Patient Information 2012 [employer]. 

## 2011-09-16 NOTE — Progress Notes (Signed)
UR chart review completed.  

## 2011-09-17 ENCOUNTER — Telehealth: Payer: Self-pay

## 2011-09-18 ENCOUNTER — Encounter: Payer: Medicaid Other | Admitting: Obstetrics and Gynecology

## 2011-09-18 ENCOUNTER — Telehealth: Payer: Self-pay

## 2011-09-18 ENCOUNTER — Encounter: Payer: Medicaid Other | Admitting: Registered Nurse

## 2011-09-20 NOTE — Telephone Encounter (Signed)
Lm on vm tcb rgd labs 

## 2011-09-24 ENCOUNTER — Telehealth: Payer: Self-pay

## 2011-09-24 NOTE — Telephone Encounter (Signed)
Lm on vm tcb rgd labs 

## 2011-09-25 NOTE — Telephone Encounter (Signed)
Try calling pt several times rgd labs pt never returned call letter sent to pt to call office rgd labs and appt.

## 2014-04-11 ENCOUNTER — Encounter (HOSPITAL_COMMUNITY): Payer: Self-pay | Admitting: *Deleted

## 2014-06-23 ENCOUNTER — Encounter (HOSPITAL_COMMUNITY): Payer: Self-pay

## 2014-06-23 ENCOUNTER — Emergency Department (HOSPITAL_COMMUNITY)
Admission: EM | Admit: 2014-06-23 | Discharge: 2014-06-23 | Disposition: A | Discharge status: Home / Self Care | Payer: Medicaid Other | Attending: Emergency Medicine | Admitting: Emergency Medicine

## 2014-06-23 DIAGNOSIS — S3992XA Unspecified injury of lower back, initial encounter: Secondary | ICD-10-CM | POA: Insufficient documentation

## 2014-06-23 DIAGNOSIS — Y998 Other external cause status: Secondary | ICD-10-CM | POA: Insufficient documentation

## 2014-06-23 DIAGNOSIS — S1081XA Abrasion of other specified part of neck, initial encounter: Secondary | ICD-10-CM | POA: Insufficient documentation

## 2014-06-23 DIAGNOSIS — Y9389 Activity, other specified: Secondary | ICD-10-CM | POA: Insufficient documentation

## 2014-06-23 DIAGNOSIS — Y92410 Unspecified street and highway as the place of occurrence of the external cause: Secondary | ICD-10-CM | POA: Insufficient documentation

## 2014-06-23 DIAGNOSIS — S0993XA Unspecified injury of face, initial encounter: Secondary | ICD-10-CM | POA: Insufficient documentation

## 2014-06-23 DIAGNOSIS — Z79899 Other long term (current) drug therapy: Secondary | ICD-10-CM | POA: Insufficient documentation

## 2014-06-23 MED ORDER — IBUPROFEN 400 MG PO TABS: 400.0000 mg | ORAL_TABLET | Freq: Four times a day (QID) | ORAL | Status: DC | PRN

## 2014-06-23 MED ORDER — METHOCARBAMOL 500 MG PO TABS: 500.0000 mg | ORAL_TABLET | Freq: Two times a day (BID) | ORAL | Status: DC
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
## 2014-06-23 NOTE — Discharge Instructions (Signed)
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
## 2019-07-01 DIAGNOSIS — Z20822 Contact with and (suspected) exposure to covid-19: Secondary | ICD-10-CM | POA: Diagnosis not present

## 2019-07-11 ENCOUNTER — Encounter (HOSPITAL_COMMUNITY): Payer: Self-pay | Admitting: *Deleted

## 2019-07-11 ENCOUNTER — Observation Stay (HOSPITAL_COMMUNITY)
Admission: EM | Admit: 2019-07-11 | Discharge: 2019-07-12 | Disposition: A | Discharge status: Home / Self Care | Payer: Medicaid Other | Attending: Obstetrics & Gynecology | Admitting: Obstetrics & Gynecology

## 2019-07-11 ENCOUNTER — Observation Stay (HOSPITAL_COMMUNITY): Payer: Medicaid Other

## 2019-07-11 ENCOUNTER — Other Ambulatory Visit: Payer: Self-pay

## 2019-07-11 DIAGNOSIS — B9689 Other specified bacterial agents as the cause of diseases classified elsewhere: Secondary | ICD-10-CM | POA: Diagnosis not present

## 2019-07-11 DIAGNOSIS — D500 Iron deficiency anemia secondary to blood loss (chronic): Secondary | ICD-10-CM | POA: Diagnosis not present

## 2019-07-11 DIAGNOSIS — R55 Syncope and collapse: Secondary | ICD-10-CM | POA: Diagnosis not present

## 2019-07-11 DIAGNOSIS — N390 Urinary tract infection, site not specified: Secondary | ICD-10-CM | POA: Diagnosis present

## 2019-07-11 DIAGNOSIS — D509 Iron deficiency anemia, unspecified: Secondary | ICD-10-CM

## 2019-07-11 DIAGNOSIS — N760 Acute vaginitis: Secondary | ICD-10-CM | POA: Diagnosis present

## 2019-07-11 DIAGNOSIS — N3001 Acute cystitis with hematuria: Secondary | ICD-10-CM

## 2019-07-11 DIAGNOSIS — N920 Excessive and frequent menstruation with regular cycle: Secondary | ICD-10-CM

## 2019-07-11 DIAGNOSIS — R000 Tachycardia, unspecified: Secondary | ICD-10-CM

## 2019-07-11 DIAGNOSIS — Z20822 Contact with and (suspected) exposure to covid-19: Secondary | ICD-10-CM | POA: Diagnosis not present

## 2019-07-11 DIAGNOSIS — D259 Leiomyoma of uterus, unspecified: Secondary | ICD-10-CM | POA: Diagnosis not present

## 2019-07-11 DIAGNOSIS — N939 Abnormal uterine and vaginal bleeding, unspecified: Secondary | ICD-10-CM

## 2019-07-11 DIAGNOSIS — D649 Anemia, unspecified: Secondary | ICD-10-CM

## 2019-07-11 HISTORY — DX: Iron deficiency anemia, unspecified: D50.9

## 2019-07-11 HISTORY — DX: Abnormal uterine and vaginal bleeding, unspecified: N93.9

## 2019-07-11 HISTORY — DX: Anemia, unspecified: D64.9

## 2019-07-11 LAB — HCG, QUANTITATIVE, PREGNANCY: hCG, Beta Chain, Quant, S: 1 m[IU]/mL (ref <5)

## 2019-07-11 LAB — RETICULOCYTES
Immature Retic Fract: 21.1 % — ABNORMAL HIGH (ref 2.3–15.9)
RBC.: 2.56 MIL/uL — ABNORMAL LOW (ref 3.87–5.11)
Retic Count, Absolute: 36.1 10*3/uL (ref 19.0–186.0)
Retic Ct Pct: 1.4 % (ref 0.4–3.1)

## 2019-07-11 LAB — BASIC METABOLIC PANEL
Anion gap: 7 (ref 5–15)
BUN: 9 mg/dL (ref 6–20)
CO2: 24 mmol/L (ref 22–32)
Calcium: 8.6 mg/dL — ABNORMAL LOW (ref 8.9–10.3)
Chloride: 104 mmol/L (ref 98–111)
Creatinine, Ser: 0.52 mg/dL (ref 0.44–1.00)
GFR calc Af Amer: >60 mL/min (ref >60)
GFR calc non Af Amer: >60 mL/min (ref >60)
Glucose, Bld: 104 mg/dL — ABNORMAL HIGH (ref 70–99)
Potassium: 3.7 mmol/L (ref 3.5–5.1)
Sodium: 135 mmol/L (ref 135–145)

## 2019-07-11 LAB — IRON AND TIBC
Iron: 8 ug/dL — ABNORMAL LOW (ref 28–170)
Saturation Ratios: 2 % — ABNORMAL LOW (ref 10.4–31.8)
TIBC: 368 ug/dL (ref 250–450)
UIBC: 360 ug/dL

## 2019-07-11 LAB — HIV ANTIBODY (ROUTINE TESTING W REFLEX): HIV Screen 4th Generation wRfx: NONREACTIVE

## 2019-07-11 LAB — URINALYSIS, ROUTINE W REFLEX MICROSCOPIC
Bilirubin Urine: NEGATIVE
Glucose, UA: NEGATIVE mg/dL
Ketones, ur: NEGATIVE mg/dL
Leukocytes,Ua: NEGATIVE
Nitrite: POSITIVE — AB
Protein, ur: NEGATIVE mg/dL
Specific Gravity, Urine: 1.005 (ref 1.005–1.030)
pH: 6 (ref 5.0–8.0)

## 2019-07-11 LAB — WET PREP, GENITAL
Sperm: NONE SEEN
Trich, Wet Prep: NONE SEEN
Yeast Wet Prep HPF POC: NONE SEEN

## 2019-07-11 LAB — CBC
HCT: 16.1 % — ABNORMAL LOW (ref 36.0–46.0)
Hemoglobin: 4.2 g/dL — CL (ref 12.0–15.0)
MCH: 16.4 pg — ABNORMAL LOW (ref 26.0–34.0)
MCHC: 26.1 g/dL — ABNORMAL LOW (ref 30.0–36.0)
MCV: 62.9 fL — ABNORMAL LOW (ref 80.0–100.0)
Platelets: 479 10*3/uL — ABNORMAL HIGH (ref 150–400)
RBC: 2.56 MIL/uL — ABNORMAL LOW (ref 3.87–5.11)
RDW: 18.6 % — ABNORMAL HIGH (ref 11.5–15.5)
WBC: 5.7 10*3/uL (ref 4.0–10.5)
nRBC: 0 % (ref 0.0–0.2)

## 2019-07-11 LAB — FOLATE: Folate: 8.9 ng/mL (ref >5.9)

## 2019-07-11 LAB — CBG MONITORING, ED: Glucose-Capillary: 99 mg/dL (ref 70–99)

## 2019-07-11 LAB — FERRITIN: Ferritin: 1 ng/mL — ABNORMAL LOW (ref 11–307)

## 2019-07-11 LAB — SARS CORONAVIRUS 2 (TAT 6-24 HRS): SARS Coronavirus 2: NEGATIVE

## 2019-07-11 LAB — PREPARE RBC (CROSSMATCH)

## 2019-07-11 LAB — ABO/RH: ABO/RH(D): O POS

## 2019-07-11 LAB — VITAMIN B12: Vitamin B-12: 208 pg/mL (ref 180–914)

## 2019-07-11 LAB — POC URINE PREG, ED: Preg Test, Ur: NEGATIVE

## 2019-07-11 MED ORDER — BISACODYL 5 MG PO TBEC: 5.0000 mg | DELAYED_RELEASE_TABLET | Freq: Every day | ORAL | Status: DC | PRN

## 2019-07-11 MED ORDER — MAGNESIUM HYDROXIDE 400 MG/5ML PO SUSP: 30.0000 mL | Freq: Every day | ORAL | Status: DC | PRN

## 2019-07-11 MED ORDER — HYDROMORPHONE HCL 1 MG/ML IJ SOLN: 0.2000 mg | INTRAMUSCULAR | Status: DC | PRN

## 2019-07-11 MED ORDER — ALUM & MAG HYDROXIDE-SIMETH 200-200-20 MG/5ML PO SUSP: 30.0000 mL | ORAL | Status: DC | PRN

## 2019-07-11 MED ORDER — POLYSACCHARIDE IRON COMPLEX 150 MG PO CAPS
150.0000 mg | ORAL_CAPSULE | Freq: Two times a day (BID) | ORAL | Status: DC
  Administered 2019-07-12: 150 mg via ORAL
  Filled 2019-07-11: qty 1

## 2019-07-11 MED ORDER — SODIUM CHLORIDE 0.9 % IV SOLN: 10.0000 mL/h | Freq: Once | INTRAVENOUS | Status: DC

## 2019-07-11 MED ORDER — MEGESTROL ACETATE 40 MG PO TABS
40.0000 mg | ORAL_TABLET | Freq: Two times a day (BID) | ORAL | Status: DC
  Administered 2019-07-11 – 2019-07-12 (×3): 40 mg via ORAL
  Filled 2019-07-11 (×4): qty 1

## 2019-07-11 MED ORDER — DOCUSATE SODIUM 100 MG PO CAPS
100.0000 mg | ORAL_CAPSULE | Freq: Two times a day (BID) | ORAL | Status: DC
  Administered 2019-07-11 – 2019-07-12 (×2): 100 mg via ORAL
  Filled 2019-07-11 (×2): qty 1

## 2019-07-11 MED ORDER — SODIUM CHLORIDE 0.9 % IV SOLN
1.0000 g | Freq: Once | INTRAVENOUS | Status: DC
  Filled 2019-07-11: qty 10

## 2019-07-11 MED ORDER — OXYCODONE-ACETAMINOPHEN 5-325 MG PO TABS: 1.0000 | ORAL_TABLET | ORAL | Status: DC | PRN

## 2019-07-11 MED ORDER — IBUPROFEN 600 MG PO TABS: 600.0000 mg | ORAL_TABLET | Freq: Four times a day (QID) | ORAL | Status: DC | PRN

## 2019-07-11 MED ORDER — SULFAMETHOXAZOLE-TRIMETHOPRIM 800-160 MG PO TABS
1.0000 | ORAL_TABLET | Freq: Two times a day (BID) | ORAL | Status: DC
  Administered 2019-07-11 – 2019-07-12 (×2): 1 via ORAL
  Filled 2019-07-11 (×2): qty 1

## 2019-07-11 MED ORDER — ONDANSETRON HCL 4 MG/2ML IJ SOLN
4.0000 mg | Freq: Four times a day (QID) | INTRAMUSCULAR | Status: DC | PRN
  Administered 2019-07-11: 4 mg via INTRAVENOUS
  Filled 2019-07-11: qty 2

## 2019-07-11 MED ORDER — METRONIDAZOLE 500 MG PO TABS
500.0000 mg | ORAL_TABLET | Freq: Once | ORAL | Status: AC
  Administered 2019-07-11: 500 mg via ORAL
  Filled 2019-07-11: qty 1

## 2019-07-11 MED ORDER — ASCORBIC ACID 500 MG PO TABS
250.0000 mg | ORAL_TABLET | Freq: Every day | ORAL | Status: DC
  Administered 2019-07-11 – 2019-07-12 (×2): 250 mg via ORAL
  Filled 2019-07-11 (×2): qty 1

## 2019-07-11 MED ORDER — ONDANSETRON HCL 4 MG/2ML IJ SOLN
4.0000 mg | Freq: Once | INTRAMUSCULAR | Status: AC
  Administered 2019-07-11: 4 mg via INTRAVENOUS
  Filled 2019-07-11: qty 2

## 2019-07-11 MED ORDER — SODIUM CHLORIDE 0.9 % IV SOLN: INTRAVENOUS | Status: DC

## 2019-07-11 MED ORDER — METRONIDAZOLE 500 MG PO TABS
500.0000 mg | ORAL_TABLET | Freq: Two times a day (BID) | ORAL | Status: DC
  Administered 2019-07-11 – 2019-07-12 (×2): 500 mg via ORAL
  Filled 2019-07-11 (×2): qty 1

## 2019-07-11 MED ORDER — ONDANSETRON HCL 4 MG PO TABS: 4.0000 mg | ORAL_TABLET | Freq: Four times a day (QID) | ORAL | Status: DC | PRN

## 2019-07-11 MED ORDER — MAGNESIUM CITRATE PO SOLN: 1.0000 | Freq: Once | ORAL | Status: DC | PRN

## 2019-07-11 MED ORDER — ZOLPIDEM TARTRATE 5 MG PO TABS: 5.0000 mg | ORAL_TABLET | Freq: Every evening | ORAL | Status: DC | PRN

## 2019-07-11 MED ORDER — TRANEXAMIC ACID 650 MG PO TABS
1300.0000 mg | ORAL_TABLET | Freq: Three times a day (TID) | ORAL | Status: DC
  Administered 2019-07-11 – 2019-07-12 (×3): 1300 mg via ORAL
  Filled 2019-07-11 (×5): qty 2

## 2019-07-11 MED ORDER — SODIUM CHLORIDE 0.9% FLUSH
3.0000 mL | Freq: Once | INTRAVENOUS | Status: AC
  Administered 2019-07-11: 3 mL via INTRAVENOUS

## 2019-07-11 MED ORDER — ACETAMINOPHEN 500 MG PO TABS
1000.0000 mg | ORAL_TABLET | Freq: Four times a day (QID) | ORAL | Status: DC | PRN
  Administered 2019-07-11: 1000 mg via ORAL
  Filled 2019-07-11: qty 2

## 2019-07-11 MED ORDER — SODIUM CHLORIDE 0.9 % IV BOLUS
1000.0000 mL | Freq: Once | INTRAVENOUS | Status: AC
  Administered 2019-07-11: 1000 mL via INTRAVENOUS

## 2019-07-11 NOTE — ED Provider Notes (Signed)
[facility] EMERGENCY DEPARTMENT Provider Note   CSN: [id number] Arrival date & time: 07/11/19  1119     History Chief Complaint  Patient presents with  . Loss of Consciousness    Deborah Craig is a 29 y.o. female.  HPI Patient is a 29 year old female with no significant past medical history other than anemia presented today with episode of syncope that occurred this morning when she walked to the bathroom and then passed out while walking back to her bedroom.   Patient states she has been on her period for the past 6 days states that is been heavier than usual.  She is a normal amount of blood on her pad each day however she notices when she uses the restroom she has 3-4 clots.  This is unusual for her.  She states that her periods do usually last week however.  She states that she feels dizzy which she describes as a sensation of almost passing out.  Her boyfriend was able to discuss episode on the phone.  He describes her episode of syncope as sudden and states that she fell and under head against a wooden dresser on the way to the ground.  States that she was unconscious for approximately 10 seconds and had no confusion or disorientation afterwards.  She did not remember the fall however she was able to answer questions appropriately and talk without any evidence of confusion.  She no history of seizures.  She denies any urinary incontinence during that episode.  She endorses mild nausea at this time.  Patient denies any alcohol or drug use.  Denies any fevers, chills, headaches.  No vision changes, slurred speech or disorientation.  She also denies any chest pain or shortness of breath.  Patient denies any abdominal pain, diarrhea, states she is drinking normally but has not been eating as much for the past 24 hours.  She has no history of syncope in the past.  She denies any structural heart disease in the family or any known arrhythmias in the family.    History  reviewed. No pertinent past medical history.  Patient Active Problem List   Diagnosis Date Noted  . Anemia 09/15/2011  . Normal pregnancy, first 09/14/2011    History reviewed. No pertinent surgical history.   OB History    Gravida  1   Para  1   Term      Preterm  1   AB      Living  1     SAB      TAB      Ectopic      Multiple      Live Births  1           Family History  Problem Relation Age of Onset  . Diabetes Mother   . Hypertension Mother     Social History   Tobacco Use  . Smoking status: Never Smoker  . Smokeless tobacco: Never Used  Substance Use Topics  . Alcohol use: No  . Drug use: No    Home Medications Prior to Admission medications   Medication Sig Start Date End Date Taking? Authorizing Provider  ibuprofen (ADVIL,MOTRIN) 400 MG tablet Take 1 tablet (400 mg total) by mouth every 6 (six) hours as needed. Patient not taking: Reported on 07/11/2019 06/23/14   Domenic Moras, PA-C  methocarbamol (ROBAXIN) 500 MG tablet Take 1 tablet (500 mg total) by mouth 2 (two) times daily. Patient not taking: Reported on 07/11/2019  06/23/14   Domenic Moras, PA-C    Allergies    Patient has no known allergies.  Review of Systems   Review of Systems  Constitutional: Positive for fatigue. Negative for chills and fever.  HENT: Negative for congestion.   Eyes: Negative for pain.  Respiratory: Negative for cough and shortness of breath.   Cardiovascular: Negative for chest pain and leg swelling.  Gastrointestinal: Negative for abdominal pain and vomiting.  Genitourinary: Positive for vaginal bleeding. Negative for dysuria.  Musculoskeletal: Negative for myalgias.  Skin: Negative for rash.  Neurological: Positive for dizziness, syncope and light-headedness. Negative for headaches.    Physical Exam Updated Vital Signs BP (!) 112/56   Pulse 99   Temp 98.6 F (37 C) (Oral)   Resp 20   Ht 5\' 2"  (1.575 m)   Wt 59 kg   LMP 06/28/2019   SpO2 99%    BMI 23.78 kg/m   Physical Exam Vitals and nursing note reviewed.  Constitutional:      General: She is not in acute distress.    Comments: Appears somewhat fatigued.  Is able answer questions appropriately follow commands.  HENT:     Head: Normocephalic and atraumatic.     Comments: No evidence of facial contusions or trauma    Nose: Nose normal.  Eyes:     General: No scleral icterus.    Pupils: Pupils are equal, round, and reactive to light.  Cardiovascular:     Rate and Rhythm: Normal rate and regular rhythm.     Pulses: Normal pulses.     Heart sounds: Normal heart sounds.  Pulmonary:     Effort: Pulmonary effort is normal. No respiratory distress.     Breath sounds: No wheezing.  Abdominal:     Palpations: Abdomen is soft.     Tenderness: There is no abdominal tenderness. There is no guarding.     Hernia: No hernia is present.  Genitourinary:    Comments: Vulva without lesions or abnormality Vaginal canal without abnormal discharge or lesion.  However there is significant blood pooling in the vaginal canal in the form of large clots. Cervix appears normal, is closed No adnexal tenderness or CMT Musculoskeletal:        General: Normal range of motion.     Cervical back: Normal range of motion.     Right lower leg: No edema.     Left lower leg: No edema.  Skin:    General: Skin is warm and dry.     Capillary Refill: Capillary refill takes less than 2 seconds.  Neurological:     Mental Status: She is alert. Mental status is at baseline.     Comments: Alert and oriented to self, place, time and event.   Speech is fluent, clear without dysarthria or dysphasia.   Strength 5/5 in upper/lower extremities  Sensation intact in upper/lower extremities   Normal gait.  Negative Romberg. No pronator drift.  Normal finger-to-nose and feet tapping.  CN I not tested  CN II grossly intact visual fields bilaterally. Did not visualize posterior eye.   CN III, IV, VI PERRLA and  EOMs intact bilaterally  CN V Intact sensation to sharp and light touch to the face  CN VII facial movements symmetric  CN VIII not tested  CN IX, X no uvula deviation, symmetric rise of soft palate  CN XI 5/5 SCM and trapezius strength bilaterally  CN XII Midline tongue protrusion, symmetric L/R movements   Psychiatric:  Mood and Affect: Mood normal.        Behavior: Behavior normal.     ED Results / Procedures / Treatments   Labs (all labs ordered are listed, but only abnormal results are displayed) Labs Reviewed  WET PREP, GENITAL - Abnormal; Notable for the following components:      Result Value   Clue Cells Wet Prep HPF POC PRESENT (*)    WBC, Wet Prep HPF POC FEW (*)    All other components within normal limits  BASIC METABOLIC PANEL - Abnormal; Notable for the following components:   Glucose, Bld 104 (*)    Calcium 8.6 (*)    All other components within normal limits  CBC - Abnormal; Notable for the following components:   RBC 2.56 (*)    Hemoglobin 4.2 (*)    HCT 16.1 (*)    MCV 62.9 (*)    MCH 16.4 (*)    MCHC 26.1 (*)    RDW 18.6 (*)    Platelets 479 (*)    All other components within normal limits  URINALYSIS, ROUTINE W REFLEX MICROSCOPIC - Abnormal; Notable for the following components:   Color, Urine STRAW (*)    Hgb urine dipstick LARGE (*)    Nitrite POSITIVE (*)    Bacteria, UA FEW (*)    All other components within normal limits  IRON AND TIBC - Abnormal; Notable for the following components:   Iron 8 (*)    Saturation Ratios 2 (*)    All other components within normal limits  FERRITIN - Abnormal; Notable for the following components:   Ferritin 1 (*)    All other components within normal limits  SARS CORONAVIRUS 2 (TAT 6-24 HRS)  URINE CULTURE  HCG, QUANTITATIVE, PREGNANCY  VITAMIN B12  FOLATE  RETICULOCYTES  RPR  HIV ANTIBODY (ROUTINE TESTING W REFLEX)  CBG MONITORING, ED  POC URINE PREG, ED  PREPARE RBC (CROSSMATCH)  TYPE AND  SCREEN  ABO/RH  GC/CHLAMYDIA PROBE AMP () NOT AT [facility]    EKG None  Radiology No results found.  Procedures Procedures (including critical care time) CRITICAL CARE Performed by: Tedd Sias   Total critical care time: 35 minutes  Critical care time was exclusive of separately billable procedures and treating other patients.  Critical care was necessary to treat or prevent imminent or life-threatening deterioration.  Critical care was time spent personally by me on the following activities: development of treatment plan with patient and/or surrogate as well as nursing, discussions with consultants, evaluation of patient's response to treatment, examination of patient, obtaining history from patient or surrogate, ordering and performing treatments and interventions, ordering and review of laboratory studies, ordering and review of radiographic studies, pulse oximetry and re-evaluation of patient's condition.  Medications Ordered in ED Medications  0.9 %  sodium chloride infusion (has no administration in time range)  cefTRIAXone (ROCEPHIN) 1 g in sodium chloride 0.9 % 100 mL IVPB (has no administration in time range)  metroNIDAZOLE (FLAGYL) tablet 500 mg (has no administration in time range)  megestrol (MEGACE) tablet 40 mg (has no administration in time range)  tranexamic acid (LYSTEDA) tablet 1,300 mg (has no administration in time range)  sodium chloride flush (NS) 0.9 % injection 3 mL (3 mLs Intravenous Given 07/11/19 1209)  sodium chloride 0.9 % bolus 1,000 mL (0 mLs Intravenous Stopped 07/11/19 1346)  ondansetron (ZOFRAN) injection 4 mg (4 mg Intravenous Given 07/11/19 1209)    ED Course  I have  reviewed the triage vital signs and the nursing notes.  Pertinent labs & imaging results that were available during my care of the patient were reviewed by me and considered in my medical decision making (see chart for details).    MDM Rules/Calculators/A&P                       Patient is healthy 29 year old female presented today with episode of syncope, shortness of breath and tachycardia.  She has been having heavy vaginal bleeding for the past 6 days normal time for her.  However she states that it has been heavier than she has ever had in the past.  She states she has no history of anemia although there is a hemoglobin of 10.3 that was recorded 7 years ago at which time anemia was added to her chart.  Broad differential for pain with syncope includes vasovagal syncope, orthostatic syncope, situational syncope, anemia, hypoglycemia, cardiac arrhythmia.  Patient denies any headache or shortness of breath that would be concerning for PE or SAH or other intracranial hemorrhage.  She denies any abdominal pain that would be concerning for ectopic pregnancy or AAA.  She has no chest pain to indicate PE or thoracic or dissection.  She is healthy and has no prior medical problems.  Patient found to be anemic at hemoglobin of 4.2.  No interval hemoglobin comparison more recently than 7 years ago.  Patient is unaware that she was ever anemic.  Suspect this is chronic however as patient has syncopal episode she will require 2 units of blood to be transfused.  Will admit patient for transfusion.  BMP unremarkable.  hCG negative for pregnancy.  UA shows nitrates and few bacteria.  Will treat empirically with ceftriaxone and positive for BV will treat with Flagyl.  She will be admitted to hospital.  Consulted GYN service 2:40 PM   Discussed case with Dr. Harolyn Rutherford who put in orders for progesterone and TXA.  She will accept patient to [address].   Final Clinical Impression(s) / ED Diagnoses Final diagnoses:  Syncope and collapse  Symptomatic anemia  Vaginal bleeding  Menorrhagia with regular cycle  Tachycardia  BV (bacterial vaginosis)  Acute cystitis with hematuria    Rx / DC Orders ED Discharge Orders    None       Tedd Sias, [place] 07/11/19 1533    Milton Ferguson, MD 07/12/19 [phone]

## 2019-07-11 NOTE — ED Notes (Signed)
ORDERED DINNER TRAY AT 4:52
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
## 2019-07-11 NOTE — H&P (Signed)
Faculty Practice Obstetrics and Gynecology Attending History and Physical  Deborah Craig is a 29 y.o. G1P0101who presented to [facility] ER today after having syncopal episode at home in the setting of heavy bleeding.  She has been bleeding for six days, that is the normal length for her period but this bleeding was heavier and characterized by clots which is unusual.  She reported having some dizziness prior to her fainting today.  Has not been seen by an OB/GYN since her delivery in 2013.  In the ER, evaluation revealed clots in vagina, but no brisk bleeding noted from cervix.  HCG negative but Hemoglobin was 4.2.  UA also concerning for UTI, wet prep showed BV.  She was typed and crossmatched for transfusion given her symptomatic anemia, and our service was called for her admission.   Upon our encounter, patient is resting comfortably, with blood transfusing.  No SOB, CP, abdominal pain.  Denies any history of seizures or other heart conditions.  Denies any abnormal vaginal discharge, fevers, chills, sweats, dysuria, nausea, vomiting, other GI or GU symptoms or other general symptoms.  History reviewed. No pertinent past medical history.   History reviewed. No pertinent surgical history.   OB History  Gravida Para Term Preterm AB Living  1 1   1   1   SAB TAB Ectopic Multiple Live Births          1    # Outcome Date GA Lbr Len/2nd Weight Sex Delivery Anes PTL Lv  1 Preterm 09/14/11 [redacted]w[redacted]d 18:44 / 00:18 6 lb 8.2 oz (2.954 kg) F Vag-Spont None  LIV     Birth Comments: wnl  Patient denies any other pertinent gynecologic issues.  Normal pap during pregnancy. Denies any STIs or cervical dysplasia.  No current facility-administered medications on file prior to encounter.   No current outpatient medications on file prior to encounter.   No Known Allergies  Social History:   reports that she has never smoked. She has never used smokeless tobacco. She reports that she does not drink alcohol or use  drugs. Family History  Problem Relation Age of Onset   Diabetes Mother    Hypertension Mother     Review of Systems: Pertinent items noted in HPI and remainder of comprehensive ROS otherwise negative.  PHYSICAL EXAM: Blood pressure (!) 115/58, pulse 86, temperature 99.6 F (37.6 C), temperature source Oral, resp. rate 16, height 5\' 2"  (1.575 m), weight 130 lb (59 kg), last menstrual period 06/28/2019, SpO2 100 %, unknown if currently breastfeeding. CONSTITUTIONAL: Well-developed, well-nourished female in no acute distress.  HENT:  Normocephalic, atraumatic, External right and left ear normal. Oropharynx is clear and moist EYES: Conjunctivae and EOM are normal. Pupils are equal, round, and reactive to light. No scleral icterus.  NECK: Normal range of motion, supple, no masses SKIN: Skin is warm and dry. No rash noted. Not diaphoretic. No erythema. No pallor. NEUROLOGIC: Alert and oriented to person, place, and time. Normal reflexes, muscle tone coordination. No cranial nerve deficit noted. PSYCHIATRIC: Normal mood and affect. Normal behavior. Normal judgment and thought content. CARDIOVASCULAR: Normal heart rate noted, regular rhythm RESPIRATORY: Effort and breath sounds normal, no problems with respiration noted ABDOMEN: Soft, nontender, nondistended. PELVIC: Minimal blood on pad MUSCULOSKELETAL: Normal range of motion. No tenderness.  No cyanosis, clubbing, or edema.  2+ distal pulses.  Labs: Results for orders placed or performed during the hospital encounter of 07/11/19 (from the past 336 hour(s))  Basic metabolic panel   Collection Time:  07/11/19 11:53 AM  Result Value Ref Range   Sodium 135 135 - 145 mmol/L   Potassium 3.7 3.5 - 5.1 mmol/L   Chloride 104 98 - 111 mmol/L   CO2 24 22 - 32 mmol/L   Glucose, Bld 104 (H) 70 - 99 mg/dL   BUN 9 6 - 20 mg/dL   Creatinine, Ser 0.52 0.44 - 1.00 mg/dL   Calcium 8.6 (L) 8.9 - 10.3 mg/dL   GFR calc non Af Amer >60 >60 mL/min   GFR calc  Af Amer >60 >60 mL/min   Anion gap 7 5 - 15  CBC   Collection Time: 07/11/19 11:53 AM  Result Value Ref Range   WBC 5.7 4.0 - 10.5 K/uL   RBC 2.56 (L) 3.87 - 5.11 MIL/uL   Hemoglobin 4.2 (LL) 12.0 - 15.0 g/dL   HCT 16.1 (L) 36.0 - 46.0 %   MCV 62.9 (L) 80.0 - 100.0 fL   MCH 16.4 (L) 26.0 - 34.0 pg   MCHC 26.1 (L) 30.0 - 36.0 g/dL   RDW 18.6 (H) 11.5 - 15.5 %   Platelets 479 (H) 150 - 400 K/uL   nRBC 0.0 0.0 - 0.2 %  hCG, quantitative, pregnancy   Collection Time: 07/11/19 11:53 AM  Result Value Ref Range   hCG, Beta Chain, Quant, S 1 <5 mIU/mL  Reticulocytes   Collection Time: 07/11/19 11:53 AM  Result Value Ref Range   Retic Ct Pct 1.4 0.4 - 3.1 %   RBC. 2.56 (L) 3.87 - 5.11 MIL/uL   Retic Count, Absolute 36.1 19.0 - 186.0 K/uL   Immature Retic Fract 21.1 (H) 2.3 - 15.9 %  CBG monitoring, ED   Collection Time: 07/11/19 12:03 PM  Result Value Ref Range   Glucose-Capillary 99 70 - 99 mg/dL  SARS CORONAVIRUS 2 (TAT 6-24 HRS) Nasopharyngeal Nasopharyngeal Swab   Collection Time: 07/11/19  1:28 PM   Specimen: Nasopharyngeal Swab  Result Value Ref Range   SARS Coronavirus 2 NEGATIVE NEGATIVE  Prepare RBC   Collection Time: 07/11/19  1:30 PM  Result Value Ref Range   Order Confirmation      ORDER PROCESSED BY BLOOD BANK Performed at [facility], [place]. [address], [place], [place] [address]   Urinalysis, Routine w reflex microscopic   Collection Time: 07/11/19  1:37 PM  Result Value Ref Range   Color, Urine STRAW (A) YELLOW   APPearance CLEAR CLEAR   Specific Gravity, Urine 1.005 1.005 - 1.030   pH 6.0 5.0 - 8.0   Glucose, UA NEGATIVE NEGATIVE mg/dL   Hgb urine dipstick LARGE (A) NEGATIVE   Bilirubin Urine NEGATIVE NEGATIVE   Ketones, ur NEGATIVE NEGATIVE mg/dL   Protein, ur NEGATIVE NEGATIVE mg/dL   Nitrite POSITIVE (A) NEGATIVE   Leukocytes,Ua NEGATIVE NEGATIVE   RBC / HPF 11-20 0 - 5 RBC/hpf   WBC, UA 0-5 0 - 5 WBC/hpf   Bacteria, UA FEW (A) NONE SEEN    Squamous Epithelial / LPF 0-5 0 - 5   Mucus PRESENT   POC Urine Pregnancy, ED (not at [facility])   Collection Time: 07/11/19  1:42 PM  Result Value Ref Range   Preg Test, Ur NEGATIVE NEGATIVE  Vitamin B12   Collection Time: 07/11/19  1:43 PM  Result Value Ref Range   Vitamin B-12 208 180 - 914 pg/mL  Iron and TIBC   Collection Time: 07/11/19  1:43 PM  Result Value Ref Range   Iron 8 (L)  28 - 170 ug/dL   TIBC 368 250 - 450 ug/dL   Saturation Ratios 2 (L) 10.4 - 31.8 %   UIBC 360 ug/dL  Ferritin   Collection Time: 07/11/19  1:43 PM  Result Value Ref Range   Ferritin 1 (L) 11 - 307 ng/mL  HIV Antibody (routine testing w rflx)   Collection Time: 07/11/19  1:43 PM  Result Value Ref Range   HIV Screen 4th Generation wRfx NON REACTIVE NON REACTIVE  Folate   Collection Time: 07/11/19  1:43 PM  Result Value Ref Range   Folate 8.9 >5.9 ng/mL  Type and screen   Collection Time: 07/11/19  1:43 PM  Result Value Ref Range   ABO/RH(D) O POS    Antibody Screen NEG    Sample Expiration 07/14/2019,2359    Unit Number [id number]    Blood Component Type RED CELLS,LR    Unit division 00    Status of Unit EXPIRED/DESTROYED    Transfusion Status OK TO TRANSFUSE    Crossmatch Result Compatible    Unit Number [id number]    Blood Component Type RED CELLS,LR    Unit division 00    Status of Unit ISSUED    Transfusion Status OK TO TRANSFUSE    Crossmatch Result Compatible    Unit Number [id number]    Blood Component Type RED CELLS,LR    Unit division 00    Status of Unit ALLOCATED    Transfusion Status OK TO TRANSFUSE    Crossmatch Result      Compatible Performed at [facility], [place] [address] [place], [place] [address]   ABO/Rh   Collection Time: 07/11/19  1:43 PM  Result Value Ref Range   ABO/RH(D)      O POS Performed at [facility] [address], [place], [place] [address]   BPAM RBC   Collection Time: 07/11/19  1:43 PM  Result Value Ref Range   ISSUE  DATE / TIME [id number]    Blood Product Unit Number [id number]    PRODUCT CODE [id number]    Unit Type and Rh 5100    Blood Product Expiration Date N8865744    ISSUE DATE / TIME W966552    Blood Product Unit Number [id number]    PRODUCT CODE [id number]    Unit Type and Rh 5100    Blood Product Expiration Date N8865744    ISSUE DATE / TIME [id number]    Blood Product Unit Number [id number]    PRODUCT CODE [id number]    Unit Type and Rh 5100    Blood Product Expiration Date N8865744   Wet prep, genital   Collection Time: 07/11/19  2:19 PM  Result Value Ref Range   Yeast Wet Prep HPF POC NONE SEEN NONE SEEN   Trich, Wet Prep NONE SEEN NONE SEEN   Clue Cells Wet Prep HPF POC PRESENT (A) NONE SEEN   WBC, Wet Prep HPF POC FEW (A) NONE SEEN   Sperm NONE SEEN      Assessment: Principal Problem:   Symptomatic anemia Active Problems:   Abnormal uterine bleeding (AUB)   Bacterial vaginitis   UTI (urinary tract infection)   Iron deficiency anemia   Plan: Admit to 6N Transfuse with 4 units of pRBCs, recheck hemoglobin in morning Megace 40 mg po bid and Lysteda 1300 mg po tid ordered Pelvic ultrasound ordered for evaluation of AUB Metronidazole ordered for BV Bactrim DS ordered for UTI Niferex bid and Vitamin C ordered for  iron deficiency, may consider Feraheme after transfusion. Routine floor care    Verita Schneiders, MD, [place], [facility] for [employer], [place]

## 2019-07-12 ENCOUNTER — Encounter (HOSPITAL_COMMUNITY): Payer: Self-pay | Admitting: Obstetrics & Gynecology

## 2019-07-12 ENCOUNTER — Encounter (HOSPITAL_COMMUNITY): Payer: Self-pay

## 2019-07-12 DIAGNOSIS — D649 Anemia, unspecified: Secondary | ICD-10-CM

## 2019-07-12 LAB — CBC
HCT: 36.2 % (ref 36.0–46.0)
Hemoglobin: 11.5 g/dL — ABNORMAL LOW (ref 12.0–15.0)
MCH: 23.5 pg — ABNORMAL LOW (ref 26.0–34.0)
MCHC: 31.8 g/dL (ref 30.0–36.0)
MCV: 74 fL — ABNORMAL LOW (ref 80.0–100.0)
Platelets: 355 10*3/uL (ref 150–400)
RBC: 4.89 MIL/uL (ref 3.87–5.11)
RDW: 21.8 % — ABNORMAL HIGH (ref 11.5–15.5)
WBC: 7.9 10*3/uL (ref 4.0–10.5)
nRBC: 0.4 % — ABNORMAL HIGH (ref 0.0–0.2)

## 2019-07-12 LAB — GC/CHLAMYDIA PROBE AMP (~~LOC~~) NOT AT ARMC
Chlamydia: POSITIVE — AB
Neisseria Gonorrhea: NEGATIVE

## 2019-07-12 LAB — PREPARE RBC (CROSSMATCH)

## 2019-07-12 LAB — RPR: RPR Ser Ql: NONREACTIVE

## 2019-07-12 MED ORDER — SODIUM CHLORIDE 0.9% IV SOLUTION: Freq: Once | INTRAVENOUS | Status: AC

## 2019-07-12 MED ORDER — MEGESTROL ACETATE 40 MG PO TABS: 40.0000 mg | ORAL_TABLET | Freq: Two times a day (BID) | ORAL | 3 refills | Status: DC

## 2019-07-12 MED ORDER — SULFAMETHOXAZOLE-TRIMETHOPRIM 800-160 MG PO TABS: 1.0000 | ORAL_TABLET | Freq: Two times a day (BID) | ORAL | 0 refills | Status: DC

## 2019-07-12 MED ORDER — METRONIDAZOLE 500 MG PO TABS: 500.0000 mg | ORAL_TABLET | Freq: Two times a day (BID) | ORAL | 0 refills | Status: DC

## 2019-07-12 NOTE — Progress Notes (Signed)
Patient discharged to home. Verbalizes understanding of all discharge instructions including discharge medications and follow up MD visits. Patient provided with a work note stating she can return to work 07/13/19 without restrictions.

## 2019-07-12 NOTE — Discharge Instructions (Signed)

## 2019-07-12 NOTE — Progress Notes (Addendum)
3 units of RBC's completed, 4th bag currently infusing. Pt had no reaction at this time. Will monitor pt.  For CBC post BT at 11 AM as oredered.

## 2019-07-12 NOTE — Discharge Summary (Signed)
    OB Discharge Summary     Patient Name: Deborah Craig DOB: [birth year] MRN: [id number]  Date of admission: 07/11/2019  Date of discharge: 07/12/2019  Admitting diagnosis: Syncope and collapse [R55] Vaginal bleeding [N93.9] Tachycardia [R00.0] BV (bacterial vaginosis) [N76.0, B96.89] Acute cystitis with hematuria [N30.01] Menorrhagia with regular cycle [N92.0] Symptomatic anemia [D64.9] Intrauterine pregnancy: Unknown     Secondary diagnosis:  Principal Problem:   Symptomatic anemia Active Problems:   Abnormal uterine bleeding (AUB)   Bacterial vaginitis   UTI (urinary tract infection)   Iron deficiency anemia      Discharge diagnosis: symptomatic anemia due to menorrhagia with regular cycle and fibroid uterus                                  Hospital course:  Patient admitted with symptomatic anemia with a Hg 4.2. She reports a heavy menstrual cycle recently. She received a blood transfusion. A pelvic ultrasound was ordered which demonstrated the presence of a fibroid uterus. She received medical management of her menorrhagia with megace and TXA with significant improvement. On day of discharge patient reports feeling well with no vaginal bleeding. Discharge instructions reviewed  Physical exam  Vitals:   07/12/19 0539 07/12/19 0540 07/12/19 0808 07/12/19 1016  BP: 106/78 106/78 114/71 111/65  Pulse: 70 70 78 75  Resp: 18 18 16 17   Temp: 98.8 F (37.1 C) 98.8 F (37.1 C) 98.9 F (37.2 C) 98.5 F (36.9 C)  TempSrc: Oral Oral Oral Oral  SpO2: 100% 100% 100% 100%  Weight:      Height:       GENERAL: Well-developed, well-nourished female in no acute distress.  LUNGS: Clear to auscultation bilaterally.  HEART: Regular rate and rhythm. ABDOMEN: Soft, nontender, nondistended. No organomegaly. EXTREMITIES: No cyanosis, clubbing, or edema, 2+ distal pulses.  Labs: Lab Results  Component Value Date   WBC 7.9 07/12/2019   HGB 11.5 (L) 07/12/2019   HCT 36.2 07/12/2019   MCV  74.0 (L) 07/12/2019   PLT 355 07/12/2019   CMP Latest Ref Rng & Units 07/11/2019  Glucose 70 - 99 mg/dL 104(H)  BUN 6 - 20 mg/dL 9  Creatinine 0.44 - 1.00 mg/dL 0.52  Sodium 135 - 145 mmol/L 135  Potassium 3.5 - 5.1 mmol/L 3.7  Chloride 98 - 111 mmol/L 104  CO2 22 - 32 mmol/L 24  Calcium 8.9 - 10.3 mg/dL 8.6(L)    Discharge instruction: per After Visit Summary and "Baby and Me Booklet".  After visit meds:  Allergies as of 07/12/2019   No Known Allergies     Medication List    TAKE these medications   megestrol 40 MG tablet Commonly known as: MEGACE Take 1 tablet (40 mg total) by mouth 2 (two) times daily.   metroNIDAZOLE 500 MG tablet Commonly known as: FLAGYL Take 1 tablet (500 mg total) by mouth 2 (two) times daily.   sulfamethoxazole-trimethoprim 800-160 MG tablet Commonly known as: BACTRIM DS Take 1 tablet by mouth 2 (two) times daily.       Diet: routine diet  Activity: Advance as tolerated.    Outpatient follow up:2 weeks  07/12/2019 Mora Bellman, MD

## 2019-07-13 LAB — TYPE AND SCREEN
ABO/RH(D): O POS
Antibody Screen: NEGATIVE
Unit division: 0
Unit division: 0
Unit division: 0
Unit division: 0
Unit division: 0

## 2019-07-13 LAB — BPAM RBC
Blood Product Expiration Date: 202102242359
Blood Product Expiration Date: 202102242359
Blood Product Expiration Date: 202102242359
Blood Product Expiration Date: 202102242359
Blood Product Expiration Date: 202102242359
ISSUE DATE / TIME: 202101311513
ISSUE DATE / TIME: 202101311956
ISSUE DATE / TIME: 202101312118
ISSUE DATE / TIME: 202102010155
ISSUE DATE / TIME: 202102010453
Unit Type and Rh: 5100
Unit Type and Rh: 5100
Unit Type and Rh: 5100
Unit Type and Rh: 5100
Unit Type and Rh: 5100

## 2019-07-13 LAB — URINE CULTURE: Culture: 70000 — AB

## 2019-08-03 ENCOUNTER — Telehealth: Payer: Self-pay | Admitting: Student

## 2019-08-03 NOTE — Telephone Encounter (Signed)
Called the patient to inform of the change in the upcoming visit. Left a voicemail message notifying the patient of the change in the appointment time as we felt it would be better for the patient to see an MD.    Also left a message informing the patient is she has been diagnosed with Covid or in close contact with someone who has had covid please give our office a call to reschedule. In addition at this present time no children or visitors are allowed to attend the appointment with you due to covid restrictions.

## 2019-08-05 ENCOUNTER — Other Ambulatory Visit (HOSPITAL_COMMUNITY)
Admission: RE | Admit: 2019-08-05 | Discharge: 2019-08-05 | Disposition: A | Discharge status: Home / Self Care | Payer: Medicaid Other | Source: Ambulatory Visit | Attending: Obstetrics and Gynecology | Admitting: Obstetrics and Gynecology

## 2019-08-05 ENCOUNTER — Encounter: Payer: Self-pay | Admitting: Obstetrics and Gynecology

## 2019-08-05 ENCOUNTER — Ambulatory Visit (INDEPENDENT_AMBULATORY_CARE_PROVIDER_SITE_OTHER): Payer: Medicaid Other | Admitting: Obstetrics and Gynecology

## 2019-08-05 ENCOUNTER — Other Ambulatory Visit: Payer: Self-pay

## 2019-08-05 ENCOUNTER — Ambulatory Visit: Payer: Medicaid Other | Admitting: Obstetrics and Gynecology

## 2019-08-05 VITALS — BP 111/67 | HR 61 | Ht 63.0 in | Wt 149.0 lb

## 2019-08-05 DIAGNOSIS — N920 Excessive and frequent menstruation with regular cycle: Secondary | ICD-10-CM | POA: Diagnosis not present

## 2019-08-05 DIAGNOSIS — Z124 Encounter for screening for malignant neoplasm of cervix: Secondary | ICD-10-CM | POA: Insufficient documentation

## 2019-08-05 DIAGNOSIS — D259 Leiomyoma of uterus, unspecified: Secondary | ICD-10-CM

## 2019-08-05 DIAGNOSIS — N939 Abnormal uterine and vaginal bleeding, unspecified: Secondary | ICD-10-CM

## 2019-08-05 HISTORY — DX: Leiomyoma of uterus, unspecified: D25.9

## 2019-08-05 MED ORDER — TRANEXAMIC ACID 650 MG PO TABS: 1300.0000 mg | ORAL_TABLET | Freq: Three times a day (TID) | ORAL | 2 refills | Status: DC

## 2019-08-05 NOTE — Progress Notes (Signed)
Patient had elevated phq9 & gad7- Dr Ilda Basset informed

## 2019-08-05 NOTE — Progress Notes (Signed)
Obstetrics and Gynecology Visit Return Patient Evaluation  Appointment Date: 08/05/2019  Primary Care Provider: Delsa Bern  OBGYN Clinic: [facility]  Chief Complaint: AUB  History of Present Illness:  Deborah Craig is a 29 y.o. P1 with above CC. PMHx significant for fibroid  Patient admitted to GYN service from 1/31-2/1 for heavy VB, anemia. Pt transfused and bleeding controlled with PO meds which patient has stopped.  LMP 2/14 and has had bleeding since. She is trying to get pregnant. Periods except for January where she had the AUB is qmonth, regular but can be heavy and long (usually 7 days but sometimes can be two weeks) and it seems periods have gotten worse over the past few months.  Review of Systems: as noted in the History of Present Illness.  Patient Active Problem List   Diagnosis Date Noted  . Abnormal uterine bleeding (AUB) 07/11/2019  . Bacterial vaginitis 07/11/2019  . Symptomatic anemia 07/11/2019  . UTI (urinary tract infection) 07/11/2019  . Iron deficiency anemia 07/11/2019   Medications:  Ronika Daubert had no medications administered during this visit. Current Outpatient Medications  Medication Sig Dispense Refill  . megestrol (MEGACE) 40 MG tablet Take 1 tablet (40 mg total) by mouth 2 (two) times daily. 60 tablet 3   No current facility-administered medications for this visit.    Allergies: has No Known Allergies.  Physical Exam:  BP 111/67   Pulse 61   Ht 5\' 3"  (1.6 m)   Wt 149 lb (67.6 kg)   LMP 07/25/2019 (Exact Date)   Breastfeeding No   BMI 26.39 kg/m  Body mass index is 26.39 kg/m. General appearance: Well nourished, well developed female in no acute distress.  Abdomen: diffusely non tender to palpation, non distended, and no masses, hernias Neuro/Psych:  Normal mood and affect.    Pelvic exam:  EGBUS: normal Vagina: mild amount of old blood in vault, no active bleeding Uterus: nttp, mobile, 10wk  sized   Labs CBC Latest Ref Rng & Units 07/12/2019 07/11/2019 09/16/2011  WBC 4.0 - 10.5 K/uL 7.9 5.7 13.7(H)  Hemoglobin 12.0 - 15.0 g/dL 11.5(L) 4.2(LL) 10.3(L)  Hematocrit 36.0 - 46.0 % 36.2 16.1(L) 32.6(L)  Platelets 150 - 400 K/uL 355 479(H) 276    Radiology CLINICAL DATA:  Pain  EXAM: TRANSABDOMINAL AND TRANSVAGINAL ULTRASOUND OF PELVIS  DOPPLER ULTRASOUND OF OVARIES  TECHNIQUE: Both transabdominal and transvaginal ultrasound examinations of the pelvis were performed. Transabdominal technique was performed for global imaging of the pelvis including uterus, ovaries, adnexal regions, and pelvic cul-de-sac.  It was necessary to proceed with endovaginal exam following the transabdominal exam to visualize the ovaries. Color and duplex Doppler ultrasound was utilized to evaluate blood flow to the ovaries.  COMPARISON:  None.  FINDINGS: Uterus  Measurements: 11 x 6.3 x 8 cm = volume: 289 mL. Fibroids are noted. The largest measures approximately 5.5 cm in the posterior uterine fundus.  Endometrium  Thickness: 11 mm.  No focal abnormality visualized.  Right ovary  Measurements: 3 x 2 x 2.4 cm = volume: 7.3 mL. Normal appearance/no adnexal mass.  Left ovary  Measurements: 3.9 x 2 x 2.6 cm = volume: 10.6 mL. Normal appearance/no adnexal mass.  Pulsed Doppler evaluation of both ovaries demonstrates normal low-resistance arterial and venous waveforms.  Other findings  No abnormal free fluid.  IMPRESSION: 1. No acute abnormality.  No evidence for ovarian torsion. 2. Multiple fibroids are noted within the uterus measuring up to 5.5 cm.  Electronically Signed   By: Constance Holster M.D.   On: 07/11/2019 20:56  Assessment: pt stable  Plan:  1. Cervical cancer screening - Cytology - PAP( [place])  2. Menorrhagia with regular cycle Pt hasn't been on anything for birth control or to help with her periods in the past. Since she's  trying to conceive I recommend Lysteda with each period to hopefully make it normal/more tolerable; she denies any VTE risk and is amenable to doing Lysteda.   Recommend she do timed intercourse and if no success in 70m to call us. I also recommended she start a women's multivitamin  3. Abnormal uterine bleeding (AUB) Repeat cbc today  RTC: 41m for annual  Durene Romans MD Attending Center for [employer] [facility])

## 2019-08-06 LAB — CBC
Hematocrit: 31.7 % — ABNORMAL LOW (ref 34.0–46.6)
Hemoglobin: 9.5 g/dL — ABNORMAL LOW (ref 11.1–15.9)
MCH: 23.9 pg — ABNORMAL LOW (ref 26.6–33.0)
MCHC: 30 g/dL — ABNORMAL LOW (ref 31.5–35.7)
MCV: 80 fL (ref 79–97)
Platelets: 472 10*3/uL — ABNORMAL HIGH (ref 150–450)
RBC: 3.98 x10E6/uL (ref 3.77–5.28)
RDW: 21 % — ABNORMAL HIGH (ref 11.7–15.4)
WBC: 4.8 10*3/uL (ref 3.4–10.8)

## 2019-08-06 LAB — TSH: TSH: 1.05 u[IU]/mL (ref 0.450–4.500)

## 2019-08-08 LAB — CYTOLOGY - PAP
Chlamydia: NEGATIVE
Comment: NEGATIVE
Comment: NEGATIVE
Comment: NORMAL
Diagnosis: NEGATIVE
Diagnosis: REACTIVE
Neisseria Gonorrhea: NEGATIVE
Trichomonas: NEGATIVE

## 2019-08-09 ENCOUNTER — Encounter: Payer: Self-pay | Admitting: Obstetrics and Gynecology

## 2019-08-09 DIAGNOSIS — D75839 Thrombocytosis, unspecified: Secondary | ICD-10-CM | POA: Insufficient documentation

## 2019-08-09 DIAGNOSIS — D473 Essential (hemorrhagic) thrombocythemia: Secondary | ICD-10-CM | POA: Insufficient documentation

## 2019-08-09 HISTORY — DX: Thrombocytosis, unspecified: D75.839

## 2020-01-09 ENCOUNTER — Encounter (HOSPITAL_COMMUNITY): Payer: Self-pay

## 2020-01-09 ENCOUNTER — Emergency Department (HOSPITAL_COMMUNITY): Payer: Medicaid Other

## 2020-01-09 ENCOUNTER — Observation Stay (HOSPITAL_COMMUNITY)
Admission: EM | Admit: 2020-01-09 | Discharge: 2020-01-10 | Disposition: A | Discharge status: Home / Self Care | Payer: Medicaid Other | Attending: Family Medicine | Admitting: Family Medicine

## 2020-01-09 DIAGNOSIS — J189 Pneumonia, unspecified organism: Secondary | ICD-10-CM | POA: Diagnosis not present

## 2020-01-09 DIAGNOSIS — D649 Anemia, unspecified: Secondary | ICD-10-CM | POA: Diagnosis not present

## 2020-01-09 DIAGNOSIS — Z20822 Contact with and (suspected) exposure to covid-19: Secondary | ICD-10-CM | POA: Diagnosis not present

## 2020-01-09 DIAGNOSIS — N939 Abnormal uterine and vaginal bleeding, unspecified: Secondary | ICD-10-CM | POA: Diagnosis not present

## 2020-01-09 DIAGNOSIS — D473 Essential (hemorrhagic) thrombocythemia: Secondary | ICD-10-CM | POA: Insufficient documentation

## 2020-01-09 DIAGNOSIS — N760 Acute vaginitis: Secondary | ICD-10-CM | POA: Insufficient documentation

## 2020-01-09 DIAGNOSIS — R42 Dizziness and giddiness: Secondary | ICD-10-CM | POA: Diagnosis present

## 2020-01-09 DIAGNOSIS — R079 Chest pain, unspecified: Secondary | ICD-10-CM | POA: Diagnosis not present

## 2020-01-09 DIAGNOSIS — J9811 Atelectasis: Secondary | ICD-10-CM | POA: Diagnosis not present

## 2020-01-09 DIAGNOSIS — R0602 Shortness of breath: Secondary | ICD-10-CM | POA: Diagnosis not present

## 2020-01-09 LAB — CBC
HCT: 18.6 % — ABNORMAL LOW (ref 36.0–46.0)
Hemoglobin: 4.4 g/dL — CL (ref 12.0–15.0)
MCH: 15 pg — ABNORMAL LOW (ref 26.0–34.0)
MCHC: 23.7 g/dL — ABNORMAL LOW (ref 30.0–36.0)
MCV: 63.5 fL — ABNORMAL LOW (ref 80.0–100.0)
Platelets: 631 10*3/uL — ABNORMAL HIGH (ref 150–400)
RBC: 2.93 MIL/uL — ABNORMAL LOW (ref 3.87–5.11)
RDW: 21.7 % — ABNORMAL HIGH (ref 11.5–15.5)
WBC: 8.9 10*3/uL (ref 4.0–10.5)
nRBC: 0.2 % (ref 0.0–0.2)

## 2020-01-09 LAB — BASIC METABOLIC PANEL
Anion gap: 10 (ref 5–15)
BUN: 5 mg/dL — ABNORMAL LOW (ref 6–20)
CO2: 23 mmol/L (ref 22–32)
Calcium: 8.8 mg/dL — ABNORMAL LOW (ref 8.9–10.3)
Chloride: 102 mmol/L (ref 98–111)
Creatinine, Ser: 0.56 mg/dL (ref 0.44–1.00)
GFR calc Af Amer: >60 mL/min (ref >60)
GFR calc non Af Amer: >60 mL/min (ref >60)
Glucose, Bld: 106 mg/dL — ABNORMAL HIGH (ref 70–99)
Potassium: 3.7 mmol/L (ref 3.5–5.1)
Sodium: 135 mmol/L (ref 135–145)

## 2020-01-09 LAB — I-STAT BETA HCG BLOOD, ED (MC, WL, AP ONLY): I-stat hCG, quantitative: <5 m[IU]/mL (ref <5)

## 2020-01-09 MED ORDER — SODIUM CHLORIDE 0.9 % IV SOLN
10.0000 mL/h | Freq: Once | INTRAVENOUS | Status: AC
  Administered 2020-01-10: 10 mL/h via INTRAVENOUS

## 2020-01-09 MED ORDER — ACETAMINOPHEN 325 MG PO TABS
650.0000 mg | ORAL_TABLET | Freq: Once | ORAL | Status: AC
  Administered 2020-01-09: 650 mg via ORAL
  Filled 2020-01-09: qty 2

## 2020-01-09 NOTE — ED Triage Notes (Addendum)
Pt arrives POV for eval of headache x 1 week, and vaginal spotting x 2 mos, intermittently. Endorses ongoing SOB x 2 mos. Pt was admitted for vag bleeding earlier this spring

## 2020-01-09 NOTE — ED Provider Notes (Addendum)
[facility] EMERGENCY DEPARTMENT Provider Note   CSN: [id number] Arrival date & time: 01/09/20  1833     History Chief Complaint  Patient presents with  . Headache  . Vaginal Bleeding    Deborah Craig is a 29 y.o. female with a hx of iron deficiency anemia, uterine fibroid, abnormal uterine bleeding presents to the Emergency Department complaining of gradual, persistent, progressively worsening fatigue onset 2 mos ago.  Pt reports she has associated shortness of breath, generalized weakness, near syncope.  Pt reports a hx of heavy vaginal bleeding requiring transfusion in early 2021.  She reports she has not had a recent follow-up with OB/GYN.  Pt reports she was previously taking iron supplements, but ran out and did not refill them.  Pt reports her LMP started on July 22 and she has had intermittent bleeding since that time. .   Pt also c/o generalized and throbbing headache onset 6 days ago.  She is not vaccinated for COVID and denies recent sick contacts.  Pt reports associated myalgias and general malaise.  No aggravating or alleviating factors.  Pt denies fever at home, but is febrile in triage.     The history is provided by the patient and medical records. No language interpreter was used.       Past Medical History:  Diagnosis Date  . Anemia 07/11/2019   SYMPTOMATIC    Patient Active Problem List   Diagnosis Date Noted  . Thrombocytosis ([place]) 08/09/2019  . Uterine leiomyoma 08/05/2019  . Abnormal uterine bleeding (AUB) 07/11/2019  . Bacterial vaginitis 07/11/2019  . Symptomatic anemia 07/11/2019  . Iron deficiency anemia 07/11/2019    Past Surgical History:  Procedure Laterality Date  . WISDOM TOOTH EXTRACTION       OB History    Gravida  1   Para  1   Term      Preterm  1   AB      Living  1     SAB      TAB      Ectopic      Multiple      Live Births  1           Family History  Problem Relation Age of Onset  .  Diabetes Mother   . Hypertension Mother     Social History   Tobacco Use  . Smoking status: Never Smoker  . Smokeless tobacco: Never Used  Vaping Use  . Vaping Use: Never used  Substance Use Topics  . Alcohol use: No  . Drug use: No    Home Medications Prior to Admission medications   Medication Sig Start Date End Date Taking? Authorizing Provider  tranexamic acid (LYSTEDA) 650 MG TABS tablet Take 2 tablets (1,300 mg total) by mouth 3 (three) times daily. Take during menses for a maximum of five days 08/05/19   Aletha Halim, MD    Allergies    Patient has no known allergies.  Review of Systems   Review of Systems  Constitutional: Positive for fatigue. Negative for appetite change, diaphoresis, fever and unexpected weight change.  HENT: Negative for congestion and mouth sores.   Eyes: Negative for visual disturbance.  Respiratory: Positive for shortness of breath. Negative for cough, chest tightness and wheezing.   Cardiovascular: Negative for chest pain.  Gastrointestinal: Negative for abdominal pain, constipation, diarrhea, nausea and vomiting.  Endocrine: Negative for polydipsia, polyphagia and polyuria.  Genitourinary: Positive for vaginal bleeding. Negative for dysuria, frequency,  hematuria and urgency.  Musculoskeletal: Positive for myalgias. Negative for back pain and neck stiffness.  Skin: Negative for rash.  Allergic/Immunologic: Negative for immunocompromised state.  Neurological: Positive for syncope ( near syncope) and headaches. Negative for light-headedness.  Hematological: Does not bruise/bleed easily.  Psychiatric/Behavioral: Negative for sleep disturbance. The patient is not nervous/anxious.     Physical Exam Updated Vital Signs BP (!) 130/62 (BP Location: Right Arm)   Pulse (!) 107   Temp (!) 101 F (38.3 C)   Resp 18   Ht 5\' 3"  (1.6 m)   Wt 65.8 kg   SpO2 100%   BMI 25.69 kg/m   Physical Exam Vitals and nursing note reviewed. Exam  conducted with a chaperone present.  Constitutional:      General: She is not in acute distress.    Appearance: She is not diaphoretic.  HENT:     Head: Normocephalic.     Comments: Pale mucous membranes Eyes:     General: No scleral icterus.    Conjunctiva/sclera: Conjunctivae normal.  Cardiovascular:     Rate and Rhythm: Regular rhythm. Tachycardia present.     Pulses: Normal pulses.          Radial pulses are 2+ on the right side and 2+ on the left side.  Pulmonary:     Effort: No tachypnea, accessory muscle usage, prolonged expiration, respiratory distress or retractions.     Breath sounds: No stridor.     Comments: Equal chest rise. No increased work of breathing. Abdominal:     General: There is no distension.     Palpations: Abdomen is soft.     Tenderness: There is no abdominal tenderness. There is no guarding or rebound.     Hernia: There is no hernia in the left inguinal area or right inguinal area.  Genitourinary:    General: Normal vulva.     Exam position: Supine.     Pubic Area: No rash.      Labia:        Right: No rash.        Left: No rash.      Vagina: Bleeding (moderate amount) present.     Cervix: Cervical bleeding present.     Uterus: Normal.      Adnexa: Right adnexa normal and left adnexa normal.  Musculoskeletal:     Cervical back: Normal range of motion.     Comments: Moves all extremities equally and without difficulty.  Lymphadenopathy:     Lower Body: No right inguinal adenopathy. No left inguinal adenopathy.  Skin:    General: Skin is warm and dry.     Capillary Refill: Capillary refill takes less than 2 seconds.     Coloration: Skin is pale.  Neurological:     Mental Status: She is alert.     GCS: GCS eye subscore is 4. GCS verbal subscore is 5. GCS motor subscore is 6.     Comments: Speech is clear and goal oriented.  Psychiatric:        Mood and Affect: Mood normal.     ED Results / Procedures / Treatments   Labs (all labs ordered  are listed, but only abnormal results are displayed) Labs Reviewed  WET PREP, GENITAL - Abnormal; Notable for the following components:      Result Value   Clue Cells Wet Prep HPF POC PRESENT (*)    WBC, Wet Prep HPF POC FEW (*)    All other components within normal  limits  CBC - Abnormal; Notable for the following components:   RBC 2.93 (*)    Hemoglobin 4.4 (*)    HCT 18.6 (*)    MCV 63.5 (*)    MCH 15.0 (*)    MCHC 23.7 (*)    RDW 21.7 (*)    Platelets 631 (*)    All other components within normal limits  BASIC METABOLIC PANEL - Abnormal; Notable for the following components:   Glucose, Bld 106 (*)    BUN 5 (*)    Calcium 8.8 (*)    All other components within normal limits  SARS CORONAVIRUS 2 BY RT PCR (HOSPITAL ORDER, [facility])  HEPATIC FUNCTION PANEL  I-STAT BETA HCG BLOOD, ED ([facility], [facility], [facility] ONLY)  PREPARE RBC (CROSSMATCH)  TYPE AND SCREEN     Radiology DG Chest Port 1 View  Result Date: 01/09/2020 CLINICAL DATA:  Chest pain and shortness of breath. EXAM: PORTABLE CHEST 1 VIEW COMPARISON:  None. FINDINGS: The cardiomediastinal contours are normal. Minimal ill-defined opacity at the right lung base. Pulmonary vasculature is normal. No consolidation, pleural effusion, or pneumothorax. No acute osseous abnormalities are seen. IMPRESSION: Minimal ill-defined opacity at the right lung base, favor atelectasis. Atypical viral pneumonia could have a similar appearance. Electronically Signed   By: Keith Rake M.D.   On: 01/09/2020 23:56    Procedures .Critical Care Performed by: Abigail Butts, PA-C Authorized by: Abigail Butts, PA-C   Critical care provider statement:    Critical care time (minutes):  45   Critical care time was exclusive of:  Separately billable procedures and treating other patients and teaching time   Critical care was necessary to treat or prevent imminent or life-threatening deterioration of the following  conditions:  Circulatory failure   Critical care was time spent personally by me on the following activities:  Discussions with consultants, evaluation of patient's response to treatment, examination of patient, ordering and performing treatments and interventions, ordering and review of laboratory studies, ordering and review of radiographic studies, pulse oximetry, re-evaluation of patient's condition, obtaining history from patient or surrogate and review of old charts   I assumed direction of critical care for this patient from another provider in my specialty: no     (including critical care time)  Medications Ordered in ED Medications  0.9 %  sodium chloride infusion (has no administration in time range)  acetaminophen (TYLENOL) tablet 650 mg (650 mg Oral Given 01/09/20 1858)    ED Course  I have reviewed the triage vital signs and the nursing notes.  Pertinent labs & imaging results that were available during my care of the patient were reviewed by me and considered in my medical decision making (see chart for details).  Clinical Course as of Jan 10 203  Sun Jan 09, 2020  2306 febrile  Temp(!): 101 F (38.3 C) [HM]  2306 Tachycardia  Pulse Rate(!): 110 [HM]  2306 No hypotension  BP: 122/67 [HM]  Mon Jan 10, 2020  0153 Transfusion started   [HM]    Clinical Course User Index [HM] Correll Denbow, Gwenlyn Perking   MDM Rules/Calculators/A&P                          Pt presents with multiple complaints.  Vaginal bleeding with severe anemia.  Hx of same.  Pt with tachycardia, SOB and near syncope, but no CP at this time.  Symptomatic anemia. Pt will need transfusion  and admission.  Pt also with HA, fever and myalgias.  Concern for COVID-19.  Pt is not vaccinated.  CXR with possible pneumonia. I personally evaluated the images.  COVID test pending.    2:33 AM Discussed with Dr. Harolyn Rutherford who would like patient admitted to the hospitalist service due to her fever.  They will consult.    Discussed patient's case with hospitalist, Dr. Gwen Pounds.  I have recommended admission and patient (and family if present) agree with this plan. Admitting physician will place admission orders.    Final Clinical Impression(s) / ED Diagnoses Final diagnoses:  Vaginal bleeding  Symptomatic anemia    Rx / DC Orders ED Discharge Orders    None       Kaysha Parsell, Gwenlyn Perking 01/10/20 4712    Veryl Speak, MD 01/10/20 [phone]

## 2020-01-10 ENCOUNTER — Encounter (HOSPITAL_COMMUNITY): Payer: Self-pay | Admitting: Family Medicine

## 2020-01-10 ENCOUNTER — Inpatient Hospital Stay (HOSPITAL_COMMUNITY): Payer: Medicaid Other

## 2020-01-10 DIAGNOSIS — D649 Anemia, unspecified: Secondary | ICD-10-CM

## 2020-01-10 DIAGNOSIS — D259 Leiomyoma of uterus, unspecified: Secondary | ICD-10-CM

## 2020-01-10 DIAGNOSIS — N939 Abnormal uterine and vaginal bleeding, unspecified: Secondary | ICD-10-CM | POA: Diagnosis present

## 2020-01-10 LAB — IRON AND TIBC
Iron: 93 ug/dL (ref 28–170)
Saturation Ratios: 26 % (ref 10.4–31.8)
TIBC: 361 ug/dL (ref 250–450)
UIBC: 268 ug/dL

## 2020-01-10 LAB — CBC
HCT: 25.8 % — ABNORMAL LOW (ref 36.0–46.0)
HCT: 29.3 % — ABNORMAL LOW (ref 36.0–46.0)
Hemoglobin: 7.1 g/dL — ABNORMAL LOW (ref 12.0–15.0)
Hemoglobin: 8.4 g/dL — ABNORMAL LOW (ref 12.0–15.0)
MCH: 18.3 pg — ABNORMAL LOW (ref 26.0–34.0)
MCH: 19.4 pg — ABNORMAL LOW (ref 26.0–34.0)
MCHC: 27.5 g/dL — ABNORMAL LOW (ref 30.0–36.0)
MCHC: 28.7 g/dL — ABNORMAL LOW (ref 30.0–36.0)
MCV: 66.7 fL — ABNORMAL LOW (ref 80.0–100.0)
MCV: 67.8 fL — ABNORMAL LOW (ref 80.0–100.0)
Platelets: 514 10*3/uL — ABNORMAL HIGH (ref 150–400)
Platelets: 509 10*3/uL — ABNORMAL HIGH (ref 150–400)
RBC: 3.87 MIL/uL (ref 3.87–5.11)
RBC: 4.32 MIL/uL (ref 3.87–5.11)
RDW: 24 % — ABNORMAL HIGH (ref 11.5–15.5)
RDW: 25.4 % — ABNORMAL HIGH (ref 11.5–15.5)
WBC: 7.8 10*3/uL (ref 4.0–10.5)
WBC: 8.4 10*3/uL (ref 4.0–10.5)
nRBC: 0.4 % — ABNORMAL HIGH (ref 0.0–0.2)
nRBC: 0.4 % — ABNORMAL HIGH (ref 0.0–0.2)

## 2020-01-10 LAB — PROTIME-INR
INR: 1.1 (ref 0.8–1.2)
Prothrombin Time: 13.4 seconds (ref 11.4–15.2)

## 2020-01-10 LAB — WET PREP, GENITAL
Sperm: NONE SEEN
Trich, Wet Prep: NONE SEEN
Yeast Wet Prep HPF POC: NONE SEEN

## 2020-01-10 LAB — HEPATIC FUNCTION PANEL
ALT: 16 U/L (ref 0–44)
AST: 16 U/L (ref 15–41)
Albumin: 3.4 g/dL — ABNORMAL LOW (ref 3.5–5.0)
Alkaline Phosphatase: 56 U/L (ref 38–126)
Bilirubin, Direct: 0.2 mg/dL (ref 0.0–0.2)
Indirect Bilirubin: 1.2 mg/dL — ABNORMAL HIGH (ref 0.3–0.9)
Total Bilirubin: 1.4 mg/dL — ABNORMAL HIGH (ref 0.3–1.2)
Total Protein: 7.2 g/dL (ref 6.5–8.1)

## 2020-01-10 LAB — TSH: TSH: 0.965 u[IU]/mL (ref 0.350–4.500)

## 2020-01-10 LAB — FOLATE: Folate: 8.6 ng/mL (ref >5.9)

## 2020-01-10 LAB — VITAMIN B12: Vitamin B-12: 281 pg/mL (ref 180–914)

## 2020-01-10 LAB — SARS CORONAVIRUS 2 BY RT PCR (HOSPITAL ORDER, PERFORMED IN ~~LOC~~ HOSPITAL LAB): SARS Coronavirus 2: NEGATIVE

## 2020-01-10 LAB — PREPARE RBC (CROSSMATCH)

## 2020-01-10 LAB — MAGNESIUM: Magnesium: 1.9 mg/dL (ref 1.7–2.4)

## 2020-01-10 MED ORDER — ACETAMINOPHEN 325 MG PO TABS: 650.0000 mg | ORAL_TABLET | Freq: Four times a day (QID) | ORAL | Status: DC | PRN

## 2020-01-10 MED ORDER — MEGESTROL ACETATE 40 MG PO TABS: 40.0000 mg | ORAL_TABLET | Freq: Two times a day (BID) | ORAL | 0 refills | Status: AC

## 2020-01-10 MED ORDER — MEGESTROL ACETATE 40 MG PO TABS
40.0000 mg | ORAL_TABLET | Freq: Two times a day (BID) | ORAL | Status: DC
  Administered 2020-01-10 (×2): 40 mg via ORAL
  Filled 2020-01-10 (×4): qty 1

## 2020-01-10 MED ORDER — SODIUM CHLORIDE 0.9 % IV SOLN: INTRAVENOUS | Status: DC

## 2020-01-10 MED ORDER — TRANEXAMIC ACID 650 MG PO TABS: 1300.0000 mg | ORAL_TABLET | Freq: Three times a day (TID) | ORAL | 2 refills | Status: AC

## 2020-01-10 MED ORDER — ACETAMINOPHEN 650 MG RE SUPP: 650.0000 mg | Freq: Four times a day (QID) | RECTAL | Status: DC | PRN

## 2020-01-10 MED ORDER — TRANEXAMIC ACID 650 MG PO TABS
1300.0000 mg | ORAL_TABLET | Freq: Three times a day (TID) | ORAL | Status: DC
  Administered 2020-01-10: 1300 mg via ORAL
  Filled 2020-01-10 (×3): qty 2

## 2020-01-10 MED ORDER — SODIUM CHLORIDE 0.9% IV SOLUTION: Freq: Once | INTRAVENOUS | Status: DC

## 2020-01-10 MED ORDER — TRANEXAMIC ACID 650 MG PO TABS
1300.0000 mg | ORAL_TABLET | Freq: Once | ORAL | Status: AC
  Administered 2020-01-10: 1300 mg via ORAL
  Filled 2020-01-10: qty 2

## 2020-01-10 MED ORDER — ONDANSETRON HCL 4 MG PO TABS: 4.0000 mg | ORAL_TABLET | Freq: Four times a day (QID) | ORAL | Status: DC | PRN

## 2020-01-10 MED ORDER — ONDANSETRON HCL 4 MG/2ML IJ SOLN: 4.0000 mg | Freq: Four times a day (QID) | INTRAMUSCULAR | Status: DC | PRN

## 2020-01-10 MED ORDER — OXYCODONE HCL 5 MG PO TABS: 5.0000 mg | ORAL_TABLET | ORAL | Status: DC | PRN

## 2020-01-10 MED ORDER — METRONIDAZOLE 500 MG PO TABS
500.0000 mg | ORAL_TABLET | Freq: Two times a day (BID) | ORAL | Status: DC
  Administered 2020-01-10 (×2): 500 mg via ORAL
  Filled 2020-01-10 (×2): qty 1

## 2020-01-10 MED ORDER — ADULT MULTIVITAMIN W/MINERALS CH
1.0000 | ORAL_TABLET | Freq: Every day | ORAL | Status: DC
  Administered 2020-01-10: 1 via ORAL
  Filled 2020-01-10: qty 1

## 2020-01-10 MED ORDER — METRONIDAZOLE 500 MG PO TABS: 500.0000 mg | ORAL_TABLET | Freq: Three times a day (TID) | ORAL | 0 refills | Status: AC

## 2020-01-10 MED ORDER — MORPHINE SULFATE (PF) 2 MG/ML IV SOLN: 2.0000 mg | INTRAVENOUS | Status: DC | PRN

## 2020-01-10 NOTE — Discharge Summary (Signed)
Physician Discharge Summary [facility]    Patient: Deborah Craig                   Admit date: 01/09/2020   DOB: [birth year]             Discharge date:01/10/2020/3:13 PM QBH:[id number]                          PCP: Deborah Bern, MD  Disposition: Home  Recommendations for Outpatient Follow-up:   . Follow up: in 1 week  . Please follow-up with GYN ASAP regarding further treatment plan as an outpatient Please call [phone] or [phone]   Discharge Condition: Stable   Code Status:   Code Status: Full Code  Diet recommendation: Regular healthy diet   Discharge Diagnoses:    Active Problems:   Abnormal uterine bleeding (AUB)   Abnormal uterine bleeding   History of Present Illness/ Hospital Course [name] Summary:    Per HPI: Deborah Craig a29 y.o.female,uterine bleeding, presents to the ED with chief complaint of feeling lightheaded. Patient reports that a week ago she passed a gush of vaginal clots.   She is a G1, P1. Her daughter is 59 years old. Patient reports her last normal period was before her admission in January. She is now having heavy flow. Stat occur every 2 to 3 weeks, last 1 week. She reports passing clots for the whole week. ultrasound from before reports as fibroid of 5.5cm.  On evaluation in ED hemoglobin stable 4.4, 3 units of PRBC for transfusion ordered OB/GYN was consulted.  Hospital courseL Status post transfusion with 3 units of PRBC, hemoglobin improved to 8.4, stabilized.  GYN has seen evaluate the patient started patient on Megace and Lysteda which was continued.  Also bacterial vaginosis none patient was started on Flagyl.  She was cleared for discharge, with close follow-up with GYN for further evaluation recommendation as an outpatient.  Detailed hospital course:   Active Problems:   Abnormal uterine bleeding (AUB)   Abnormal uterine bleeding  1. Symptomatic anemia 1. hgb 4.4 >> 7.1 >>8.4  2. Complaint no  further active bleeding 3. 2/2 abnormal uterine bleeding 4. Consult gyn --- recommended no surgical intervention at this time 5. 3 units PRBCs>>> pending completion of transfusion  6. Monitor H&H 7. Patient was on megace and Lysteda at last admission -recommended clear for discharge, no inpatient intervention 8.  2. Abnormal uterine bleeding 1. History of admission for abnormal uterine bleeding in Jan 2021 2. HCG <5  Previous US = 1. No acute abnormality. No evidence for ovarian torsion. 2. Multiple fibroids are noted within the uterus measuring up to 5.5 3. Was seen and evaluated by GYN recommended no surgical invention at this time 4. Repeat US >>> reporting fibroid large 7 cm posterior fundal fibroid -per GYN no significant changes from 06/30/2019 [place]  3. Fever/bacterial vaginosis 1. CXR shows opacity that is likely atelectasis - no cough 2. UA negative urine positive for bacterial vaginosis, started on metronidazole 3. Covid negative 4. Antibiotics are not indicated at this time 5. Continue to monitor for signs/symptoms of infection 4. Thrombocytosis -in the setting of severe anemia 1. Acute phase reactant 2. Likely to improve with treatments above 3. Platelets 631, 514, 4. INR 1.1 5. Bacterial Vaginosis 1. Clue cells on wet prep 2. Treat with flagyl      Discharge Instructions:   Discharge Instructions    Activity as tolerated - No restrictions  Complete by: As directed    Call MD for:  difficulty breathing, headache or visual disturbances   Complete by: As directed    Call MD for:  extreme fatigue   Complete by: As directed    Diet - low sodium heart healthy   Complete by: As directed    Discharge instructions   Complete by: As directed    Follow-up with Dr. Harolyn Rutherford or your GYN ASAP for further evaluation recommendation special surgical invention, Please call [phone] [facility]) or [phone] [facility])   Increase activity slowly   Complete  by: As directed        Medication List    TAKE these medications   megestrol 40 MG tablet Commonly known as: MEGACE Take 1 tablet (40 mg total) by mouth 2 (two) times daily for 10 days.   metroNIDAZOLE 500 MG tablet Commonly known as: FLAGYL Take 1 tablet (500 mg total) by mouth 3 (three) times daily for 10 days.   tranexamic acid 650 MG Tabs tablet Commonly known as: LYSTEDA Take 2 tablets (1,300 mg total) by mouth 3 (three) times daily for 5 days. What changed: additional instructions       No Known Allergies   Procedures /Studies:   DG Chest Port 1 View  Result Date: 01/09/2020 CLINICAL DATA:  Chest pain and shortness of breath. EXAM: PORTABLE CHEST 1 VIEW COMPARISON:  None. FINDINGS: The cardiomediastinal contours are normal. Minimal ill-defined opacity at the right lung base. Pulmonary vasculature is normal. No consolidation, pleural effusion, or pneumothorax. No acute osseous abnormalities are seen. IMPRESSION: Minimal ill-defined opacity at the right lung base, favor atelectasis. Atypical viral pneumonia could have a similar appearance. Electronically Signed   By: Keith Rake M.D.   On: 01/09/2020 23:56   US PELVIC COMPLETE WITH TRANSVAGINAL  Result Date: 01/10/2020 CLINICAL DATA:  Abnormal uterine bleeding EXAM: TRANSABDOMINAL AND TRANSVAGINAL ULTRASOUND OF PELVIS TECHNIQUE: Both transabdominal and transvaginal ultrasound examinations of the pelvis were performed. Transabdominal technique was performed for global imaging of the pelvis including uterus, ovaries, adnexal regions, and pelvic cul-de-sac. It was necessary to proceed with endovaginal exam following the transabdominal exam to visualize the ovary. COMPARISON:  July 11, 2019 FINDINGS: Uterus Measurements: 10.9 x 8.6 x 9.7 cm = volume: 479 mL. There is a large posterior fundal uterine fibroid again noted measuring 7.3 x 6.7 x 6.9 cm which abuts the endometrium. Endometrium Thickness: 10.8 mm.  No focal  abnormality visualized. Right ovary Measurements: 3.4 x 2.5 x 2.8 cm = volume: 12.9 mL. Probable dominant follicle or small ovarian cyst is seen. Left ovary Measurements: 4.0 x 1.5 x 2.6 cm = volume: 8.2 mL. Normal appearance/no adnexal mass. Other findings No abnormal free fluid. IMPRESSION: Unchanged large posterior fundal uterine fibroid. Endometrium measuring 10.8 mm, please correlate with patient's cycle. Electronically Signed   By: Prudencio Pair M.D.   On: 01/10/2020 04:02     Subjective:   Patient was seen and examined 01/10/2020, 3:13 PM Patient stable today. No acute distress.  No issues overnight Stable for discharge.  Discharge Exam:    Vitals:   01/10/20 0852 01/10/20 0910 01/10/20 0944 01/10/20 1217  BP: (!) 116/64 (!) 116/56 (!) 116/41 114/76  Pulse: 88 87 91 82  Resp: 18 18 17 18   Temp:  99.2 F (37.3 C) 98.6 F (37 C) 98.3 F (36.8 C)  TempSrc:   Oral Oral  SpO2: 99% 100% 100% 100%  Weight:      Height:  General: Pt lying comfortably in bed & appears in no obvious distress. Cardiovascular: S1 & S2 heard, RRR, S1/S2 +. No murmurs, rubs, gallops or clicks. No JVD or pedal edema. Respiratory: Clear to auscultation without wheezing, rhonchi or crackles. No increased work of breathing. Abdominal:  Non-distended, non-tender & soft. No organomegaly or masses appreciated. Normal bowel sounds heard. CNS: Alert and oriented. No focal deficits. Extremities: no edema, no cyanosis    The results of significant diagnostics from this hospitalization (including imaging, microbiology, ancillary and laboratory) are listed below for reference.      Microbiology:   Recent Results (from the past 240 hour(s))  Wet prep, genital     Status: Abnormal   Collection Time: 01/10/20  1:27 AM   Specimen: Genital  Result Value Ref Range Status   Yeast Wet Prep HPF POC NONE SEEN NONE SEEN Final   Trich, Wet Prep NONE SEEN NONE SEEN Final   Clue Cells Wet Prep HPF POC PRESENT (A)  NONE SEEN Final   WBC, Wet Prep HPF POC FEW (A) NONE SEEN Final   Sperm NONE SEEN  Final    Comment: Performed at [facility], [place]. [address], [place], [place] [address]  SARS Coronavirus 2 by RT PCR (hospital order, performed in [facility] hospital lab) Nasopharyngeal Nasopharyngeal Swab     Status: None   Collection Time: 01/10/20  1:33 AM   Specimen: Nasopharyngeal Swab  Result Value Ref Range Status   SARS Coronavirus 2 NEGATIVE NEGATIVE Final    Comment: (NOTE) SARS-CoV-2 target nucleic acids are NOT DETECTED.  The SARS-CoV-2 RNA is generally detectable in upper and lower respiratory specimens during the acute phase of infection. The lowest concentration of SARS-CoV-2 viral copies this assay can detect is 250 copies / mL. A negative result does not preclude SARS-CoV-2 infection and should not be used as the sole basis for treatment or other patient management decisions.  A negative result may occur with improper specimen collection / handling, submission of specimen other than nasopharyngeal swab, presence of viral mutation(s) within the areas targeted by this assay, and inadequate number of viral copies (<250 copies / mL). A negative result must be combined with clinical observations, patient history, and epidemiological information.  Fact Sheet for Patients:   StrictlyIdeas.no  Fact Sheet for Healthcare Providers: BankingDealers.co.za  This test is not yet approved or  cleared by the [place] FDA and has been authorized for detection and/or diagnosis of SARS-CoV-2 by FDA under an Emergency Use Authorization (EUA).  This EUA will remain in effect (meaning this test can be used) for the duration of the COVID-19 declaration under Section 564(b)(1) of the Act, 21 U.S.C. section 360bbb-3(b)(1), unless the authorization is terminated or revoked sooner.  Performed at [facility], [place] [address], [place],  [place] [address]      Labs:   CBC: Recent Labs  Lab 01/09/20 1958 01/10/20 0700 01/10/20 1233  WBC 8.9 7.8 8.4  HGB 4.4* 7.1* 8.4*  HCT 18.6* 25.8* 29.3*  MCV 63.5* 66.7* 67.8*  PLT 631* 514* 585*   Basic Metabolic Panel: Recent Labs  Lab 01/09/20 1958 01/10/20 0700  NA 135  --   K 3.7  --   CL 102  --   CO2 23  --   GLUCOSE 106*  --   BUN 5*  --   CREATININE 0.56  --   CALCIUM 8.8*  --   MG  --  1.9   Liver  Function Tests: Recent Labs  Lab 01/10/20 0750  AST 16  ALT 16  ALKPHOS 56  BILITOT 1.4*  PROT 7.2  ALBUMIN 3.4*   BNP (last 3 results) No results for input(s): BNP in the last 8760 hours. Cardiac Enzymes: No results for input(s): CKTOTAL, CKMB, CKMBINDEX, TROPONINI in the last 168 hours. CBG: No results for input(s): GLUCAP in the last 168 hours. Hgb A1c No results for input(s): HGBA1C in the last 72 hours. Lipid Profile No results for input(s): CHOL, HDL, LDLCALC, TRIG, CHOLHDL, LDLDIRECT in the last 72 hours. Thyroid function studies Recent Labs    01/10/20 0750  TSH 0.965   Anemia work up Recent Labs    01/10/20 0750  VITAMINB12 281  FOLATE 8.6  TIBC 361  IRON 93   Urinalysis    Component Value Date/Time   COLORURINE STRAW (A) 07/11/2019 1337   APPEARANCEUR CLEAR 07/11/2019 1337   LABSPEC 1.005 07/11/2019 1337   PHURINE 6.0 07/11/2019 1337   GLUCOSEU NEGATIVE 07/11/2019 1337   HGBUR LARGE (A) 07/11/2019 1337   BILIRUBINUR NEGATIVE 07/11/2019 [place] 07/11/2019 1337   PROTEINUR NEGATIVE 07/11/2019 1337   UROBILINOGEN 1.0 09/13/2011 1815   NITRITE POSITIVE (A) 07/11/2019 1337   LEUKOCYTESUR NEGATIVE 07/11/2019 1337         Time coordinating discharge: Over 45 minutes  SIGNED: Deatra James, MD, FACP, FHM. [facility],  Please use [email or web address] to Page If 7PM-7AM, please contact night-coverage [email or web address] [email or web address] 01/10/2020, 3:13 PM

## 2020-01-10 NOTE — Progress Notes (Signed)
AVS given and reviewed with pt. Medications discussed. All questions answered to satisfaction. Pt verbalized understanding of information given. Pt to be escorted off the unit with all belongings via wheelchair by staff member.  

## 2020-01-10 NOTE — Plan of Care (Signed)

## 2020-01-10 NOTE — H&P (Signed)
TRH H&P    Patient Demographics:    Deborah Craig, is a 30 y.o. female  MRN: [id number]  DOB - [birth year]  Admit Date - 01/09/2020  Referring MD/NP/PA: PA Hannah  Outpatient Primary MD for the patient is Delsa Bern, MD  Patient coming from: Home  Chief complaint- light headed   HPI:    Deborah Craig  is a 28 y.o. female, uterine bleeding, presents to the ED with chief complaint of feeling lightheaded.  Patient reports that a week ago she passed a gush of vaginal clots.  She has had generalized weakness since then has continued to get worse over the week.  Patient reports that her weakness, lightheadedness are worse with standing.  When she lays down she gets throbbing headaches.  She has not identified a factor that makes her lightheadedness better.  She reports associated shortness of breath and chest pain.  The chest pain started 5 days ago.  It lasted 1 day.  It was nonexertional.  Nothing made it better.  It spontaneously resolved.  Patient reports that she has not had fever chills.  She is a G1, P1.  Her daughter is 5 years old.  Patient reports her last normal period was before her admission in January.  She is now having heavy flow.  Stat occur every 2 to 3 weeks, last 1 week.  She reports passing clots for the whole week.  She reports that she followed up with GYN but cannot remember what they say.  She does note the medications that she was on last time she was in the hospital were helpful.  She did not know she had a fibroid, ultrasound from before reports as fibroid of 5.5cm.  GYN was consulted from ER. Patient denies smoking cigarettes, admits to smoking marijuana, denies other illicit drugs. Patient does not drink alcohol. Patient has no other complaints at this time.    Review of systems:    Review of Systems  Constitutional: Positive for malaise/fatigue. Negative for chills, diaphoresis and fever.    HENT: Negative for congestion, ear pain, sinus pain, sore throat and tinnitus.   Eyes: Negative for blurred vision, double vision and photophobia.  Respiratory: Positive for shortness of breath. Negative for cough, hemoptysis and wheezing.   Cardiovascular: Positive for chest pain (for one day, 5 days ago). Negative for palpitations and leg swelling.  Gastrointestinal: Positive for nausea. Negative for abdominal pain, blood in stool, constipation, diarrhea and vomiting.  Genitourinary: Negative for dysuria, frequency, hematuria and urgency.  Musculoskeletal: Negative for myalgias.  Skin: Negative for itching and rash.  Neurological: Positive for weakness and headaches. Negative for dizziness and tingling.  Psychiatric/Behavioral: Positive for substance abuse.    All other systems reviewed and are negative.    Past History of the following :    Past Medical History:  Diagnosis Date  . Anemia 07/11/2019   SYMPTOMATIC      Past Surgical History:  Procedure Laterality Date  . WISDOM TOOTH EXTRACTION        Social History:  Social History   Tobacco Use  . Smoking status: Never Smoker  . Smokeless tobacco: Never Used  Substance Use Topics  . Alcohol use: No       Family History :     Family History  Problem Relation Age of Onset  . Diabetes Mother   . Hypertension Mother    Reviewed   Home Medications:   Prior to Admission medications   Medication Sig Start Date End Date Taking? Authorizing Provider  tranexamic acid (LYSTEDA) 650 MG TABS tablet Take 2 tablets (1,300 mg total) by mouth 3 (three) times daily. Take during menses for a maximum of five days Patient not taking: Reported on 01/10/2020 08/05/19   Aletha Halim, MD     Allergies:    No Known Allergies   Physical Exam:   Vitals  Blood pressure (!) 121/61, pulse 86, temperature 98.8 F (37.1 C), temperature source Oral, resp. rate 20, height 5\' 3"  (1.6 m), weight 65.8 kg, SpO2 100 %.  1.   General: Lying supine in bed in no acute distress  2. Psychiatric: Mood and behavior are normal for situation  3. Neurologic: CN II-XII are grossly intact No focal deficit on limited exam  4. HEENMT:  Head is atraumatic, normocephalic Neck is supple Trachea is midline Mucous membranes dry  5. Respiratory : LCTABL  6. Cardiovascular : HR RR, No murmur, rub, or gallop  7. Gastrointestinal:  Abd is soft, non distended and non tender to palpation  8. Skin:  No acute lesions on limited skin exam  9.Musculoskeletal:  No acute deformity, not peripheral edema    Data Review:    CBC Recent Labs  Lab 01/09/20 1958  WBC 8.9  HGB 4.4*  HCT 18.6*  PLT 631*  MCV 63.5*  MCH 15.0*  MCHC 23.7*  RDW 21.7*   ------------------------------------------------------------------------------------------------------------------  Results for orders placed or performed during the hospital encounter of 01/09/20 (from the past 48 hour(s))  CBC     Status: Abnormal   Collection Time: 01/09/20  7:58 PM  Result Value Ref Range   WBC 8.9 4.0 - 10.5 K/uL   RBC 2.93 (L) 3.87 - 5.11 MIL/uL   Hemoglobin 4.4 (LL) 12.0 - 15.0 g/dL    Comment: REPEATED TO VERIFY Reticulocyte Hemoglobin testing may be clinically indicated, consider ordering this additional test [id number] THIS CRITICAL RESULT HAS VERIFIED AND BEEN CALLED TO G.KOPP,RN BY MELISSA BROGDON ON 08 01 2021 AT 2038, AND HAS BEEN READ BACK.     HCT 18.6 (L) 36 - 46 %   MCV 63.5 (L) 80.0 - 100.0 fL   MCH 15.0 (L) 26.0 - 34.0 pg   MCHC 23.7 (L) 30.0 - 36.0 g/dL   RDW 21.7 (H) 11.5 - 15.5 %   Platelets 631 (H) 150 - 400 K/uL   nRBC 0.2 0.0 - 0.2 %    Comment: Performed at [facility] [address] [place], [place] [address]  Basic metabolic panel     Status: Abnormal   Collection Time: 01/09/20  7:58 PM  Result Value Ref Range   Sodium 135 135 - 145 mmol/L   Potassium 3.7 3.5 - 5.1 mmol/L   Chloride 102 98 - 111 mmol/L    CO2 23 22 - 32 mmol/L   Glucose, Bld 106 (H) 70 - 99 mg/dL    Comment: Glucose reference range applies only to samples taken after fasting for at least 8 hours.   BUN 5 (L) 6 - 20 mg/dL  Creatinine, Ser 0.56 0.44 - 1.00 mg/dL   Calcium 8.8 (L) 8.9 - 10.3 mg/dL   GFR calc non Af Amer >60 >60 mL/min   GFR calc Af Amer >60 >60 mL/min   Anion gap 10 5 - 15    Comment: Performed at [facility] [address], [place], [place] [address]  I-Stat beta hCG blood, ED     Status: None   Collection Time: 01/09/20  9:39 PM  Result Value Ref Range   I-stat hCG, quantitative <5.0 <5 mIU/mL   Comment 3            Comment:   GEST. AGE      CONC.  (mIU/mL)   <=1 WEEK        5 - 50     2 WEEKS       50 - 500     3 WEEKS       100 - 10,000     4 WEEKS     1,000 - 30,000        FEMALE AND NON-PREGNANT FEMALE:     LESS THAN 5 mIU/mL   Prepare RBC (crossmatch)     Status: None   Collection Time: 01/09/20 11:04 PM  Result Value Ref Range   Order Confirmation      ORDER PROCESSED BY BLOOD BANK Performed at [facility], [place] [address], [place], [place] [address]   Type and screen     Status: None (Preliminary result)   Collection Time: 01/09/20 11:15 PM  Result Value Ref Range   ABO/RH(D) O POS    Antibody Screen NEG    Sample Expiration 01/12/2020,2359    Unit Number [id number]    Blood Component Type RED CELLS,LR    Unit division 00    Status of Unit ALLOCATED    Transfusion Status OK TO TRANSFUSE    Crossmatch Result Compatible    Unit Number [id number]    Blood Component Type RED CELLS,LR    Unit division 00    Status of Unit ISSUED    Transfusion Status OK TO TRANSFUSE    Crossmatch Result      Compatible Performed at [facility], [place] [address], [place], [place] [address]    Unit Number [id number]    Blood Component Type RED CELLS,LR    Unit division 00    Status of Unit ALLOCATED    Transfusion Status OK TO TRANSFUSE    Crossmatch Result  Compatible   Wet prep, genital     Status: Abnormal   Collection Time: 01/10/20  1:27 AM   Specimen: Genital  Result Value Ref Range   Yeast Wet Prep HPF POC NONE SEEN NONE SEEN   Trich, Wet Prep NONE SEEN NONE SEEN   Clue Cells Wet Prep HPF POC PRESENT (A) NONE SEEN   WBC, Wet Prep HPF POC FEW (A) NONE SEEN   Sperm NONE SEEN     Comment: Performed at [facility], [place] [address], [place], [place] [address]    Chemistries  Recent Labs  Lab 01/09/20 1958  NA 135  K 3.7  CL 102  CO2 23  GLUCOSE 106*  BUN 5*  CREATININE 0.56  CALCIUM 8.8*   ------------------------------------------------------------------------------------------------------------------  ------------------------------------------------------------------------------------------------------------------ GFR: Estimated Creatinine Clearance: 95.5 mL/min (by C-G formula based on SCr of 0.56 mg/dL). Liver Function Tests: No results for input(s): AST, ALT, ALKPHOS, BILITOT, PROT, ALBUMIN in the last 168 hours. No results  for input(s): LIPASE, AMYLASE in the last 168 hours. No results for input(s): AMMONIA in the last 168 hours. Coagulation Profile: No results for input(s): INR, PROTIME in the last 168 hours. Cardiac Enzymes: No results for input(s): CKTOTAL, CKMB, CKMBINDEX, TROPONINI in the last 168 hours. BNP (last 3 results) No results for input(s): PROBNP in the last 8760 hours. HbA1C: No results for input(s): HGBA1C in the last 72 hours. CBG: No results for input(s): GLUCAP in the last 168 hours. Lipid Profile: No results for input(s): CHOL, HDL, LDLCALC, TRIG, CHOLHDL, LDLDIRECT in the last 72 hours. Thyroid Function Tests: No results for input(s): TSH, T4TOTAL, FREET4, T3FREE, THYROIDAB in the last 72 hours. Anemia Panel: No results for input(s): VITAMINB12, FOLATE, FERRITIN, TIBC, IRON, RETICCTPCT in the last 72  hours.  --------------------------------------------------------------------------------------------------------------- Urine analysis:    Component Value Date/Time   COLORURINE STRAW (A) 07/11/2019 1337   APPEARANCEUR CLEAR 07/11/2019 1337   LABSPEC 1.005 07/11/2019 1337   PHURINE 6.0 07/11/2019 1337   GLUCOSEU NEGATIVE 07/11/2019 1337   HGBUR LARGE (A) 07/11/2019 1337   BILIRUBINUR NEGATIVE 07/11/2019 1337   KETONESUR NEGATIVE 07/11/2019 1337   PROTEINUR NEGATIVE 07/11/2019 1337   UROBILINOGEN 1.0 09/13/2011 1815   NITRITE POSITIVE (A) 07/11/2019 1337   LEUKOCYTESUR NEGATIVE 07/11/2019 1337      Imaging Results:    DG Chest Port 1 View  Result Date: 01/09/2020 CLINICAL DATA:  Chest pain and shortness of breath. EXAM: PORTABLE CHEST 1 VIEW COMPARISON:  None. FINDINGS: The cardiomediastinal contours are normal. Minimal ill-defined opacity at the right lung base. Pulmonary vasculature is normal. No consolidation, pleural effusion, or pneumothorax. No acute osseous abnormalities are seen. IMPRESSION: Minimal ill-defined opacity at the right lung base, favor atelectasis. Atypical viral pneumonia could have a similar appearance. Electronically Signed   By: Keith Rake M.D.   On: 01/09/2020 23:56       Assessment & Plan:    Active Problems:   Abnormal uterine bleeding (AUB)   1. Symptomatic anemia 1. hgb 4.4 2. 2/2 abnormal uterine bleeding 3. Shortness of breath and lightheadedness 4. Consult gyn 5. 3 units PRBCs ordered by the ED 6. Trend HH 7. Patient was on megace and Lysteda at last admission - will defer to gyn - appreciate recs 8. Continue to monitor 2. Abnormal uterine bleeding 1. History of admission for abnormal uterine bleeding in Jan 2021 2. HCG <5   Previous US = 1. No acute abnormality.  No evidence for ovarian torsion. 2. Multiple fibroids are noted  within the uterus measuring up to 5.5 3. Consult gyn 4. Repeat US 5. See plan above 3. Fever 1. CXR  shows opacity that is likely atelectasis - no cough 2. UA pending 3. Covid pending 4. Antibiotics are not indicated at this time 5. Continue to monitor for signs/symptoms of infection 4. Thrombocytosis 1. Acute phase reactant 2. Likely to improve with treatments above 3. Continue to trend 5. Bacterial Vaginosis 1. Clue cells on wet prep 2. Treat with flagyl    DVT Prophylaxis-  SCDs   AM Labs Ordered, also please review Full Orders  Family Communication: Admission, patients condition and plan of care including tests being ordered have been discussed with the patient and boyfriend who indicate understanding and agree with the plan and Code Status.  Code Status:  Full  Admission status: Inpatient :The appropriate admission status for this patient is INPATIENT. Inpatient status is judged to be reasonable and necessary in order to provide the required intensity  of service to ensure the patient's safety. The patient's presenting symptoms, physical exam findings, and initial radiographic and laboratory data in the context of their chronic comorbidities is felt to place them at high risk for further clinical deterioration. Furthermore, it is not anticipated that the patient will be medically stable for discharge from the hospital within 2 midnights of admission. The following factors support the admission status of inpatient.     The patient's presenting symptoms include Generalized weakness The initial radiographic and laboratory data are worrisome because of Hgb 4.4  The chronic co-morbidities include Hx of abnormal uterine bleeding        I certify that at the point of admission it is my clinical judgment that the patient will require inpatient hospital care spanning beyond 2 midnights from the point of admission due to high intensity of service, high risk for further deterioration and high frequency of surveillance required.  Time spent in minutes : [place]

## 2020-01-10 NOTE — Progress Notes (Signed)
PROGRESS NOTE    Patient: Deborah Craig                            PCP: Delsa Bern, MD                    DOB: 1991/05/01            DOA: 01/09/2020 CBJ:[id number]             DOS: 01/10/2020, 12:39 PM   LOS: 0 days   Date of Service: The patient was seen and examined on 01/10/2020  Subjective:   The patient was seen and examined this Am. Hemodynamically stable Not complaining of any further  No issues overnight.  Tolerating blood transfusion  Brief Narrative:  Per HPI:  Kensly Bowmer  is a 29 y.o. female, uterine bleeding, presents to the ED with chief complaint of feeling lightheaded.  Patient reports that a week ago she passed a gush of vaginal clots.   She is a G1, P1.  Her daughter is 65 years old.  Patient reports her last normal period was before her admission in January.  She is now having heavy flow.  Stat occur every 2 to 3 weeks, last 1 week.  She reports passing clots for the whole week. ultrasound from before reports as fibroid of 5.5cm. On evaluation in ED hemoglobin stable 4.4, 3 units of PRBC for transfusion ordered OB/GYN was consulted.  Assessment & Plan:   Active Problems:   Abnormal uterine bleeding (AUB)   Abnormal uterine bleeding  1. Symptomatic anemia 1. hgb 4.4 >> 7.1  2. Complaint no further active bleeding 3. 2/2 abnormal uterine bleeding 4. Consult gyn --- recommended no surgical intervention at this time 5. 3 units PRBCs>>> pending completion of transfusion  6. Monitor H&H 7. Patient was on megace and Lysteda at last admission - will defer to gyn - appreciate recs 8. Continue to monitor 2. Abnormal uterine bleeding 1. History of admission for abnormal uterine bleeding in Jan 2021 2. HCG <5              Previous US = 1. No acute abnormality. No evidence for ovarian torsion. 2. Multiple fibroids are noted     within the uterus measuring up to 5.5 3. Was seen and evaluated by GYN recommended no surgical invention at this time 4. Repeat US >>>  reporting fibroid large 7 cm posterior fundal fibroid -per GYN no significant changes from 06/30/2019 [place] 5. e 3. Fever/bacterial vaginosis 1. CXR shows opacity that is likely atelectasis - no cough 2. UA negative urine positive for bacterial vaginosis, started on metronidazole 3. Covid negative 4. Antibiotics are not indicated at this time 5. Continue to monitor for signs/symptoms of infection 4. Thrombocytosis -in the setting of severe anemia 1. Acute phase reactant 2. Likely to improve with treatments above 3. Platelets 631, 514, 4. INR 1.1 5. Bacterial Vaginosis 1. Clue cells on wet prep 2. Treat with flagyl    Antimicrobials: -As needed tramadol    Consultants: GYN   ------------------------------------------------------------------------------------------------------------------------------------------------  DVT prophylaxis:  SCD/Compression stockings Code Status:   Code Status: Full Code Family Communication: No family member present at bedside- The above findings and plan of care has been discussed with patient   in detail,  they expressed understanding and agreement of above. -Advance care planning has been discussed.   Admission status:    Status is: Inpatient  Remains inpatient appropriate  because:Inpatient level of care appropriate due to severity of illness   Dispo: The patient is from: Home              Anticipated d/c is to: Home              Anticipated d/c date is: 1 day .Marland Kitchen If remains hemodynamically stable.  When H&H stabilizes,  and if no further vaginal bleeding              Patient currently is not medically stable to d/c.        Procedures:   No admission procedures for hospital encounter.     Antimicrobials:  Anti-infectives (From admission, onward)   Start     Dose/Rate Route Frequency Ordered Stop   01/10/20 0300  metroNIDAZOLE (FLAGYL) tablet 500 mg     Discontinue     500 mg Oral Every 12 hours 01/10/20 0249 01/16/20 2159        Medication:  . sodium chloride   Intravenous Once  . megestrol  40 mg Oral BID  . metroNIDAZOLE  500 mg Oral Q12H  . multivitamin with minerals  1 tablet Oral Daily  . tranexamic acid  1,300 mg Oral TID    acetaminophen **OR** acetaminophen, morphine injection, ondansetron **OR** ondansetron (ZOFRAN) IV, oxyCODONE   Objective:   Vitals:   01/10/20 0852 01/10/20 0910 01/10/20 0944 01/10/20 1217  BP: (!) 116/64 (!) 116/56 (!) 116/41 114/76  Pulse: 88 87 91 82  Resp: 18 18 17 18   Temp:  99.2 F (37.3 C) 98.6 F (37 C) 98.3 F (36.8 C)  TempSrc:   Oral Oral  SpO2: 99% 100% 100% 100%  Weight:      Height:        Intake/Output Summary (Last 24 hours) at 01/10/2020 1239 Last data filed at 01/10/2020 1217 Gross per 24 hour  Intake 950 ml  Output --  Net 950 ml   Filed Weights   01/09/20 1855  Weight: 65.8 kg     Examination:   Physical Exam  Constitution:  Alert, cooperative, no distress,  Appears calm and comfortable  Psychiatric: Normal and stable mood and affect, cognition intact,   HEENT: Normocephalic, PERRL, otherwise with in Normal limits  Chest:Chest symmetric Cardio vascular:  S1/S2, RRR, No murmure, No Rubs or Gallops  pulmonary: Clear to auscultation bilaterally, respirations unlabored, negative wheezes / crackles Abdomen: Soft, non-tender, non-distended, bowel sounds,no masses, no organomegaly Muscular skeletal: Limited exam - in bed, able to move all 4 extremities, Normal strength,  Neuro: CNII-XII intact. , normal motor and sensation, reflexes intact  Extremities: No pitting edema lower extremities, +2 pulses  Skin: Dry, warm to touch, negative for any Rashes, No open wounds Wounds: per nursing documentation    ------------------------------------------------------------------------------------------------------------------------------------------    LABs:  CBC Latest Ref Rng & Units 01/10/2020 01/09/2020 08/05/2019  WBC 4.0 - 10.5 K/uL 7.8 8.9 4.8   Hemoglobin 12.0 - 15.0 g/dL 7.1(L) 4.4(LL) 9.5(L)  Hematocrit 36 - 46 % 25.8(L) 18.6(L) 31.7(L)  Platelets 150 - 400 K/uL 514(H) 631(H) 472(H)   CMP Latest Ref Rng & Units 01/10/2020 01/09/2020 07/11/2019  Glucose 70 - 99 mg/dL - 106(H) 104(H)  BUN 6 - 20 mg/dL - 5(L) 9  Creatinine 0.44 - 1.00 mg/dL - 0.56 0.52  Sodium 135 - 145 mmol/L - 135 135  Potassium 3.5 - 5.1 mmol/L - 3.7 3.7  Chloride 98 - 111 mmol/L - 102 104  CO2 22 - 32 mmol/L - 23  24  Calcium 8.9 - 10.3 mg/dL - 8.8(L) 8.6(L)  Total Protein 6.5 - 8.1 g/dL 7.2 - -  Total Bilirubin 0.3 - 1.2 mg/dL 1.4(H) - -  Alkaline Phos 38 - 126 U/L 56 - -  AST 15 - 41 U/L 16 - -  ALT 0 - 44 U/L 16 - -       Micro Results Recent Results (from the past 240 hour(s))  Wet prep, genital     Status: Abnormal   Collection Time: 01/10/20  1:27 AM   Specimen: Genital  Result Value Ref Range Status   Yeast Wet Prep HPF POC NONE SEEN NONE SEEN Final   Trich, Wet Prep NONE SEEN NONE SEEN Final   Clue Cells Wet Prep HPF POC PRESENT (A) NONE SEEN Final   WBC, Wet Prep HPF POC FEW (A) NONE SEEN Final   Sperm NONE SEEN  Final    Comment: Performed at [facility], [address], [place], [place] [address]  SARS Coronavirus 2 by RT PCR (hospital order, performed in [facility] hospital lab) Nasopharyngeal Nasopharyngeal Swab     Status: None   Collection Time: 01/10/20  1:33 AM   Specimen: Nasopharyngeal Swab  Result Value Ref Range Status   SARS Coronavirus 2 NEGATIVE NEGATIVE Final    Comment: (NOTE) SARS-CoV-2 target nucleic acids are NOT DETECTED.  The SARS-CoV-2 RNA is generally detectable in upper and lower respiratory specimens during the acute phase of infection. The lowest concentration of SARS-CoV-2 viral copies this assay can detect is 250 copies / mL. A negative result does not preclude SARS-CoV-2 infection and should not be used as the sole basis for treatment or other patient management decisions.  A negative result may  occur with improper specimen collection / handling, submission of specimen other than nasopharyngeal swab, presence of viral mutation(s) within the areas targeted by this assay, and inadequate number of viral copies (<250 copies / mL). A negative result must be combined with clinical observations, patient history, and epidemiological information.  Fact Sheet for Patients:   StrictlyIdeas.no  Fact Sheet for Healthcare Providers: BankingDealers.co.za  This test is not yet approved or  cleared by the [place] FDA and has been authorized for detection and/or diagnosis of SARS-CoV-2 by FDA under an Emergency Use Authorization (EUA).  This EUA will remain in effect (meaning this test can be used) for the duration of the COVID-19 declaration under Section 564(b)(1) of the Act, 21 U.S.C. section 360bbb-3(b)(1), unless the authorization is terminated or revoked sooner.  Performed at [facility], [place] [address], [place], [place] [address]     Radiology Reports DG Chest Athena 1 View  Result Date: 01/09/2020 CLINICAL DATA:  Chest pain and shortness of breath. EXAM: PORTABLE CHEST 1 VIEW COMPARISON:  None. FINDINGS: The cardiomediastinal contours are normal. Minimal ill-defined opacity at the right lung base. Pulmonary vasculature is normal. No consolidation, pleural effusion, or pneumothorax. No acute osseous abnormalities are seen. IMPRESSION: Minimal ill-defined opacity at the right lung base, favor atelectasis. Atypical viral pneumonia could have a similar appearance. Electronically Signed   By: Keith Rake M.D.   On: 01/09/2020 23:56   US PELVIC COMPLETE WITH TRANSVAGINAL  Result Date: 01/10/2020 CLINICAL DATA:  Abnormal uterine bleeding EXAM: TRANSABDOMINAL AND TRANSVAGINAL ULTRASOUND OF PELVIS TECHNIQUE: Both transabdominal and transvaginal ultrasound examinations of the pelvis were performed. Transabdominal technique was  performed for global imaging of the pelvis including uterus, ovaries, adnexal regions, and pelvic cul-de-sac. It  was necessary to proceed with endovaginal exam following the transabdominal exam to visualize the ovary. COMPARISON:  July 11, 2019 FINDINGS: Uterus Measurements: 10.9 x 8.6 x 9.7 cm = volume: 479 mL. There is a large posterior fundal uterine fibroid again noted measuring 7.3 x 6.7 x 6.9 cm which abuts the endometrium. Endometrium Thickness: 10.8 mm.  No focal abnormality visualized. Right ovary Measurements: 3.4 x 2.5 x 2.8 cm = volume: 12.9 mL. Probable dominant follicle or small ovarian cyst is seen. Left ovary Measurements: 4.0 x 1.5 x 2.6 cm = volume: 8.2 mL. Normal appearance/no adnexal mass. Other findings No abnormal free fluid. IMPRESSION: Unchanged large posterior fundal uterine fibroid. Endometrium measuring 10.8 mm, please correlate with patient's cycle. Electronically Signed   By: Prudencio Pair M.D.   On: 01/10/2020 04:02    SIGNED: Deatra James, MD, FACP, FHM. [facility],  Pager (please use [email or web address] to [email or web address])  If 7PM-7AM, please contact night-coverage [email or web address] [email or web address] [facility] 01/10/2020, 12:39 PM

## 2020-01-10 NOTE — ED Notes (Signed)
Patient transported to Ultrasound 

## 2020-01-10 NOTE — Progress Notes (Signed)
Pt arrived to room 6N17 via stretcher from the ED. Received report from Vicente Males, Therapist, sports. See assessment. Will continue to monitor.

## 2020-01-10 NOTE — Consult Note (Signed)
OBSTETRICS AND GYNECOLOGY ATTENDING CONSULT NOTE  Consult Date: 01/10/2020  Reason for Consult: Symptomatic anemia, chronic abnormal uterine bleeding, fibroids, fevers Consulting Provider: Abigail Butts, PA-C   Assessment/Plan: Fever evaluation ongoing, COVID negative. Does not meet criteria for SIRS.  CXR concerning for possible respiratory infectious process, ?atypical viral pneumonia. Will defer management and further evalution to [facility] primary team. Transfusion ordered for anemia with hemoglobin of 4.4, recommend at least 3-4 units of pRBCs Megace 40 mg po bid and Lysteda 1300 mg po tid ordered for chronic AUB, no urgent surgical intervention needed. Metronidazole ordered for bacterial vaginitis. Pelvic ultrasound showed no significant changes from previous scan in 06/2019; has large 7 cm posterior fundal fibroid abutting the endometrium contributing to this AUB. During her outpatient follow up, she will be encouraged to consider surgical management of this fibroid given her noncompliance with recommended medical therapy.   Appreciate care of Deborah Craig by her primary team Will follow along with daily rounding on patient Please call [phone] [facility] OB/GYN Consult Attending Monday-Friday 8am - 5pm) or [phone] [facility] OB/GYN Attending On Call all day, every day) for any gynecologic concerns at any time.  Thank you for involving [place] in the care of this patient.  Total consultation time including face-to-face time with patient (>50% of time), reviewing chart and documentation: 40 minutes  Verita Schneiders, MD, [place], [facility] for [place], [place] Phone: [phone]     History of Present Illness: Deborah Craig is an 29 y.o. G7P0101 female with history of fibroids, chronic abnormal uterine bleeding, anemia with  Admission for tranfusion in 07/11/2019 who presented to the [facility] ER 01/09/2020 evening with worsening  fatigues.  Also endorsed SOB, presyncopal episodes, throbbing headaches, myalgias and malaise. Reported intermittent bleeding since LMP on 12/30/19, but not currently heavy. Of note, patient was seen by our service on 08/03/2019 and prescribed Lysteda to use during her periods, but she has not been using this as prescribed because she "ran out of the medication". She declined hormonal therapy as she is trying to conceive.  In the ER, she was noted to be febrile to 101, with pulse of 110. Hgb 4.4 and was ordered for pRBCs.   She was noted to have some moderate bleeding but not brisk bleeding. CXR showed right lung base ill-defined opacity, COVID negative.  The plan was to admit her to [facility] service, and we were consulted for recommendations given her AUB, fibroids.  On encounter with her this morning, she reports feeling better after receiving transfusion of 2 pRBCs. Bleeding is improved.  Denies any abdominal pain.  No other GYN symptoms.  OB/GYN History: OB History  Gravida Para Term Preterm AB Living  1 1 0 1 0 1  SAB TAB Ectopic Multiple Live Births  0 0 0 0 1    # Outcome Date GA Lbr Len/2nd Weight Sex Delivery Anes PTL Lv  1 Preterm 09/14/11 [redacted]w[redacted]d 18:44 / 00:18 2954 g F Vag-Spont None  LIV     Birth Comments: wnl     Name: Sibal,GIRL Kathaleya     Apgar1: 9  Apgar5: 9  Known fibroids, chronic AUB and associated anemia. Patient denies any other pertinent gynecologic issues.  Normal pap smear. Denies any STIs or cervical dysplasia.   Patient Active Problem List   Diagnosis Date Noted  . Abnormal uterine bleeding 01/10/2020  . Thrombocytosis ([place]) 08/09/2019  . Uterine leiomyoma 08/05/2019  . Abnormal uterine bleeding (AUB)  07/11/2019  . Bacterial vaginitis 07/11/2019  . Symptomatic anemia 07/11/2019  . Iron deficiency anemia 07/11/2019    Past Medical History:  Diagnosis Date  . Abnormal uterine bleeding (AUB) 07/11/2019  . Anemia 07/11/2019   SYMPTOMATIC  . Iron deficiency anemia  07/11/2019  . Uterine leiomyoma 08/05/2019    Past Surgical History:  Procedure Laterality Date  . WISDOM TOOTH EXTRACTION      Family History  Problem Relation Age of Onset  . Diabetes Mother   . Hypertension Mother     Social History:  reports that she has never smoked. She has never used smokeless tobacco. She reports that she does not drink alcohol and does not use drugs.  Allergies: No Known Allergies  Medications:  I have reviewed the patient's current medications. Prior to Admission: (Not in a hospital admission)  Scheduled: . megestrol  40 mg Oral BID  . metroNIDAZOLE  500 mg Oral Q12H  . multivitamin with minerals  1 tablet Oral Daily  . tranexamic acid  1,300 mg Oral TID   Continuous: . sodium chloride     PRN: Anti-infectives (From admission, onward)   Start     Dose/Rate Route Frequency Ordered Stop   01/10/20 0300  metroNIDAZOLE (FLAGYL) tablet 500 mg     Discontinue     500 mg Oral Every 12 hours 01/10/20 0249 01/16/20 2159      Review of Systems: Pertinent items noted in HPI and remainder of comprehensive ROS otherwise negative.  Focused Physical Examination  Patient Vitals for the past 24 hrs:  BP Temp Temp src Pulse Resp SpO2 Height Weight  01/10/20 0700 104/73 -- -- 87 19 99 % -- --  01/10/20 0630 96/72 -- -- 82 22 100 % -- --  01/10/20 0548 113/65 -- -- 86 (!) 24 100 % -- --  01/10/20 0501 113/68 -- -- 86 18 100 % -- --  01/10/20 0441 105/65 99 F (37.2 C) Oral 90 20 100 % -- --  01/10/20 0430 122/72 -- -- 93 23 100 % -- --  01/10/20 0426 106/65 98.8 F (37.1 C) -- 88 18 -- -- --  01/10/20 0301 (!) 121/61 98.8 F (37.1 C) Oral 86 20 100 % -- --  01/10/20 0300 (!) 121/61 -- -- 88 -- 100 % -- --  01/10/20 0138 (!) 115/62 99.1 F (37.3 C) Oral 83 18 -- -- --  01/10/20 0118 114/72 98.4 F (36.9 C) Oral 92 16 -- -- --  01/09/20 2228 (!) 130/62 -- -- (!) 107 18 100 % -- --  01/09/20 1855 -- -- -- -- -- -- 5\' 3"  (1.6 m) 65.8 kg  01/09/20 1854  122/67 (!) 101 F (38.3 C) -- (!) 110 18 100 % -- --   CONSTITUTIONAL: Well-developed, well-nourished female in no acute distress.  SKIN: Skin is warm and dry. No rash noted. Not diaphoretic. No erythema. No pallor. [place]: Alert and oriented to person, place, and time. Normal reflexes, muscle tone coordination. No cranial nerve deficit noted. PSYCHIATRIC: Normal mood and affect. Normal behavior. Normal judgment and thought content. CARDIOVASCULAR: Elevated heart rate noted RESPIRATORY:  Effort and breath sounds normal, no problems with respiration noted. ABDOMEN: Soft, no distention noted.  No tenderness, rebound or guarding.  PELVIC: Minimal blood noted on pad. Done in the presence of a chaperone (her RN) MUSCULOSKELETAL: Normal range of motion. No tenderness.  No cyanosis, clubbing, or edema.  2+ distal pulses.  Results for orders placed or performed during the  hospital encounter of 01/09/20 (from the past 72 hour(s))  CBC     Status: Abnormal   Collection Time: 01/09/20  7:58 PM  Result Value Ref Range   WBC 8.9 4.0 - 10.5 K/uL   RBC 2.93 (L) 3.87 - 5.11 MIL/uL   Hemoglobin 4.4 (LL) 12.0 - 15.0 g/dL    Comment: REPEATED TO VERIFY Reticulocyte Hemoglobin testing may be clinically indicated, consider ordering this additional test [id number] THIS CRITICAL RESULT HAS VERIFIED AND BEEN CALLED TO G.KOPP,RN BY MELISSA BROGDON ON 08 01 2021 AT 2038, AND HAS BEEN READ BACK.     HCT 18.6 (L) 36 - 46 %   MCV 63.5 (L) 80.0 - 100.0 fL   MCH 15.0 (L) 26.0 - 34.0 pg   MCHC 23.7 (L) 30.0 - 36.0 g/dL   RDW 21.7 (H) 11.5 - 15.5 %   Platelets 631 (H) 150 - 400 K/uL   nRBC 0.2 0.0 - 0.2 %    Comment: Performed at [facility] [address] [place], [place] [address]  Basic metabolic panel     Status: Abnormal   Collection Time: 01/09/20  7:58 PM  Result Value Ref Range   Sodium 135 135 - 145 mmol/L   Potassium 3.7 3.5 - 5.1 mmol/L   Chloride 102 98 - 111 mmol/L   CO2 23 22 - 32  mmol/L   Glucose, Bld 106 (H) 70 - 99 mg/dL    Comment: Glucose reference range applies only to samples taken after fasting for at least 8 hours.   BUN 5 (L) 6 - 20 mg/dL   Creatinine, Ser 0.56 0.44 - 1.00 mg/dL   Calcium 8.8 (L) 8.9 - 10.3 mg/dL   GFR calc non Af Amer >60 >60 mL/min   GFR calc Af Amer >60 >60 mL/min   Anion gap 10 5 - 15    Comment: Performed at [facility] [address], [place], [place] [address]  I-Stat beta hCG blood, ED     Status: None   Collection Time: 01/09/20  9:39 PM  Result Value Ref Range   I-stat hCG, quantitative <5.0 <5 mIU/mL   Comment 3            Comment:   GEST. AGE      CONC.  (mIU/mL)   <=1 WEEK        5 - 50     2 WEEKS       50 - 500     3 WEEKS       100 - 10,000     4 WEEKS     1,000 - 30,000        FEMALE AND NON-PREGNANT FEMALE:     LESS THAN 5 mIU/mL   Prepare RBC (crossmatch)     Status: None   Collection Time: 01/09/20 11:04 PM  Result Value Ref Range   Order Confirmation      ORDER PROCESSED BY BLOOD BANK Performed at [facility], [place] [address], [place], [place] [address]   Type and screen     Status: None (Preliminary result)   Collection Time: 01/09/20 11:15 PM  Result Value Ref Range   ABO/RH(D) O POS    Antibody Screen NEG    Sample Expiration 01/12/2020,2359    Unit Number [id number]    Blood Component Type RED CELLS,LR    Unit division 00    Status of Unit ISSUED    Transfusion Status OK TO  TRANSFUSE    Crossmatch Result      Compatible Performed at [facility], [place] [address], [place], [place] [address]    Unit Number [id number]    Blood Component Type RED CELLS,LR    Unit division 00    Status of Unit ISSUED    Transfusion Status OK TO TRANSFUSE    Crossmatch Result Compatible    Unit Number [id number]    Blood Component Type RED CELLS,LR    Unit division 00    Status of Unit ALLOCATED    Transfusion Status OK TO TRANSFUSE    Crossmatch Result Compatible   Wet prep,  genital     Status: Abnormal   Collection Time: 01/10/20  1:27 AM   Specimen: Genital  Result Value Ref Range   Yeast Wet Prep HPF POC NONE SEEN NONE SEEN   Trich, Wet Prep NONE SEEN NONE SEEN   Clue Cells Wet Prep HPF POC PRESENT (A) NONE SEEN   WBC, Wet Prep HPF POC FEW (A) NONE SEEN   Sperm NONE SEEN     Comment: Performed at [facility], [place] [address], [place], [place] [address]  SARS Coronavirus 2 by RT PCR (hospital order, performed in [facility] hospital lab) Nasopharyngeal Nasopharyngeal Swab     Status: None   Collection Time: 01/10/20  1:33 AM   Specimen: Nasopharyngeal Swab  Result Value Ref Range   SARS Coronavirus 2 NEGATIVE NEGATIVE    Comment: (NOTE) SARS-CoV-2 target nucleic acids are NOT DETECTED.  The SARS-CoV-2 RNA is generally detectable in upper and lower respiratory specimens during the acute phase of infection. The lowest concentration of SARS-CoV-2 viral copies this assay can detect is 250 copies / mL. A negative result does not preclude SARS-CoV-2 infection and should not be used as the sole basis for treatment or other patient management decisions.  A negative result may occur with improper specimen collection / handling, submission of specimen other than nasopharyngeal swab, presence of viral mutation(s) within the areas targeted by this assay, and inadequate number of viral copies (<250 copies / mL). A negative result must be combined with clinical observations, patient history, and epidemiological information.  Fact Sheet for Patients:   StrictlyIdeas.no  Fact Sheet for Healthcare Providers: BankingDealers.co.za  This test is not yet approved or  cleared by the [place] FDA and has been authorized for detection and/or diagnosis of SARS-CoV-2 by FDA under an Emergency Use Authorization (EUA).  This EUA will remain in effect (meaning this test can be used) for the duration of the COVID-19  declaration under Section 564(b)(1) of the Act, 21 U.S.C. section 360bbb-3(b)(1), unless the authorization is terminated or revoked sooner.  Performed at [facility], [place] [address] [place], [place] [address]      DG Chest Port 1 View  Result Date: 01/09/2020 CLINICAL DATA:  Chest pain and shortness of breath. EXAM: PORTABLE CHEST 1 VIEW COMPARISON:  None. FINDINGS: The cardiomediastinal contours are normal. Minimal ill-defined opacity at the right lung base. Pulmonary vasculature is normal. No consolidation, pleural effusion, or pneumothorax. No acute osseous abnormalities are seen. IMPRESSION: Minimal ill-defined opacity at the right lung base, favor atelectasis. Atypical viral pneumonia could have a similar appearance. Electronically Signed   By: Keith Rake M.D.   On: 01/09/2020 23:56   US PELVIC COMPLETE WITH TRANSVAGINAL  Result Date: 01/10/2020 CLINICAL DATA:  Abnormal uterine bleeding EXAM: TRANSABDOMINAL AND TRANSVAGINAL ULTRASOUND OF PELVIS TECHNIQUE: Both transabdominal and transvaginal  ultrasound examinations of the pelvis were performed. Transabdominal technique was performed for global imaging of the pelvis including uterus, ovaries, adnexal regions, and pelvic cul-de-sac. It was necessary to proceed with endovaginal exam following the transabdominal exam to visualize the ovary. COMPARISON:  July 11, 2019 FINDINGS: Uterus Measurements: 10.9 x 8.6 x 9.7 cm = volume: 479 mL. There is a large posterior fundal uterine fibroid again noted measuring 7.3 x 6.7 x 6.9 cm which abuts the endometrium. Endometrium Thickness: 10.8 mm.  No focal abnormality visualized. Right ovary Measurements: 3.4 x 2.5 x 2.8 cm = volume: 12.9 mL. Probable dominant follicle or small ovarian cyst is seen. Left ovary Measurements: 4.0 x 1.5 x 2.6 cm = volume: 8.2 mL. Normal appearance/no adnexal mass. Other findings No abnormal free fluid. IMPRESSION: Unchanged large posterior fundal uterine fibroid.  Endometrium measuring 10.8 mm, please correlate with patient's cycle. Electronically Signed   By: Prudencio Pair M.D.   On: 01/10/2020 04:02

## 2020-01-11 LAB — TYPE AND SCREEN
ABO/RH(D): O POS
Antibody Screen: NEGATIVE
Unit division: 0
Unit division: 0
Unit division: 0

## 2020-01-11 LAB — GC/CHLAMYDIA PROBE AMP (~~LOC~~) NOT AT ARMC
Chlamydia: NEGATIVE
Comment: NEGATIVE
Comment: NORMAL
Neisseria Gonorrhea: NEGATIVE

## 2020-01-11 LAB — BPAM RBC
Blood Product Expiration Date: 202108202359
Blood Product Expiration Date: 202108202359
Blood Product Expiration Date: 202108292359
ISSUE DATE / TIME: 202108020057
ISSUE DATE / TIME: 202108020401
ISSUE DATE / TIME: 202108020836
Unit Type and Rh: 5100
Unit Type and Rh: 5100
Unit Type and Rh: 5100

## 2020-02-05 DIAGNOSIS — N939 Abnormal uterine and vaginal bleeding, unspecified: Secondary | ICD-10-CM

## 2020-02-09 ENCOUNTER — Encounter: Payer: Self-pay | Admitting: *Deleted

## 2020-02-09 DIAGNOSIS — N939 Abnormal uterine and vaginal bleeding, unspecified: Secondary | ICD-10-CM

## 2020-02-10 MED ORDER — TRANEXAMIC ACID 650 MG PO TABS: 1300.0000 mg | ORAL_TABLET | Freq: Three times a day (TID) | ORAL | 2 refills | Status: DC

## 2020-02-10 MED ORDER — MEGESTROL ACETATE 40 MG PO TABS: 40.0000 mg | ORAL_TABLET | Freq: Two times a day (BID) | ORAL | 5 refills | Status: DC

## 2020-03-17 ENCOUNTER — Ambulatory Visit (INDEPENDENT_AMBULATORY_CARE_PROVIDER_SITE_OTHER): Payer: Medicaid Other | Admitting: Obstetrics and Gynecology

## 2020-03-17 ENCOUNTER — Other Ambulatory Visit: Payer: Self-pay

## 2020-03-17 ENCOUNTER — Encounter: Payer: Self-pay | Admitting: Obstetrics and Gynecology

## 2020-03-17 VITALS — BP 120/84 | HR 114 | Ht 61.0 in | Wt 145.6 lb

## 2020-03-17 DIAGNOSIS — D259 Leiomyoma of uterus, unspecified: Secondary | ICD-10-CM | POA: Diagnosis not present

## 2020-03-17 DIAGNOSIS — N939 Abnormal uterine and vaginal bleeding, unspecified: Secondary | ICD-10-CM | POA: Diagnosis not present

## 2020-03-17 MED ORDER — IRON POLYSACCH CMPLX-B12-FA 150-0.025-1 MG PO CAPS: 1.0000 | ORAL_CAPSULE | Freq: Every day | ORAL | 0 refills | Status: DC

## 2020-03-17 MED ORDER — DOCUSATE SODIUM 100 MG PO CAPS: 100.0000 mg | ORAL_CAPSULE | Freq: Two times a day (BID) | ORAL | 2 refills | Status: DC | PRN

## 2020-03-17 NOTE — Progress Notes (Signed)
Obstetrics and Gynecology Established Patient Evaluation  Appointment Date: 03/17/2020  OBGYN Clinic: Center for [facility]  Chief Complaint: follow up hospitalization for AUB, anemia  History of Present Illness: Deborah Craig is a 29 y.o. Asian G1P0101 (No LMP recorded. (Menstrual status: Irregular Periods).), seen for the above chief complaint. Her past medical history is significant for fibroids, BMI 27, h/o anemia and transfusion  Patient was admitted for 23h observation on 8/2 for AUB and anemia with Hgb of 4.4. She received 3U PRBCs and was started on megace and Lysteda. D/c Hgb was 8.4. U/s at that hospitalization was similar to back in January of this year when she was also admitted for AUB, anemia and received blood. TSH, GC/CT, wet prep and INR negative. She was seen for follow up by GYN shortly after her January hospitalization, and she said that she desired to conceive, so plan was made for Lysteda with each period and RTC in 6 months if she hadn't conceived.  Patient states she is taking the megace one pill qday and the lysteda bid and is only having some bloody mucus.   Review of Systems: Pertinent items noted in HPI and remainder of comprehensive ROS otherwise negative.    Patient Active Problem List   Diagnosis Date Noted  . Abnormal uterine bleeding 01/10/2020  . Thrombocytosis 08/09/2019  . Uterine leiomyoma 08/05/2019  . Abnormal uterine bleeding (AUB) 07/11/2019  . Bacterial vaginitis 07/11/2019  . Symptomatic anemia 07/11/2019  . Iron deficiency anemia 07/11/2019    Past Medical History:  Past Medical History:  Diagnosis Date  . Abnormal uterine bleeding (AUB) 07/11/2019  . Anemia 07/11/2019   SYMPTOMATIC  . Iron deficiency anemia 07/11/2019  . Uterine leiomyoma 08/05/2019    Past Surgical History:  Past Surgical History:  Procedure Laterality Date  . WISDOM TOOTH EXTRACTION      Past Obstetrical History:  OB History  Gravida  Para Term Preterm AB Living  1 1   1   1   SAB TAB Ectopic Multiple Live Births          1    # Outcome Date GA Lbr Len/2nd Weight Sex Delivery Anes PTL Lv  1 Preterm 09/14/11 [redacted]w[redacted]d 18:44 / 00:18 6 lb 8.2 oz (2.954 kg) F Vag-Spont None  LIV     Birth Comments: wnl    Past Gynecological History: As per HPI. Periods: she states she has qmonth periods that last for five days and she has AUB after that History of Pap Smear(s): Yes.   Last pap 2021, which was negative   Social History:  Social History   Socioeconomic History  . Marital status: Single    Spouse name: Not on file  . Number of children: Not on file  . Years of education: Not on file  . Highest education level: Not on file  Occupational History  . Not on file  Tobacco Use  . Smoking status: Never Smoker  . Smokeless tobacco: Never Used  Vaping Use  . Vaping Use: Never used  Substance and Sexual Activity  . Alcohol use: No  . Drug use: No  . Sexual activity: Yes    Birth control/protection: None  Other Topics Concern  . Not on file  Social History Narrative  . Not on file   Social Determinants of Health   Financial Resource Strain:   . Difficulty of Paying Living Expenses: Not on file  Food Insecurity:   . Worried About Charity fundraiser  in the Last Year: Not on file  . Ran Out of Food in the Last Year: Not on file  Transportation Needs:   . Lack of Transportation (Medical): Not on file  . Lack of Transportation (Non-Medical): Not on file  Physical Activity:   . Days of Exercise per Week: Not on file  . Minutes of Exercise per Session: Not on file  Stress:   . Feeling of Stress : Not on file  Social Connections:   . Frequency of Communication with Friends and Family: Not on file  . Frequency of Social Gatherings with Friends and Family: Not on file  . Attends Religious Services: Not on file  . Active Member of Clubs or Organizations: Not on file  . Attends Archivist Meetings: Not on file   . Marital Status: Not on file  Intimate Partner Violence:   . Fear of Current or Ex-Partner: Not on file  . Emotionally Abused: Not on file  . Physically Abused: Not on file  . Sexually Abused: Not on file    Family History:  Family History  Problem Relation Age of Onset  . Diabetes Mother   . Hypertension Mother     Medications Deborah Craig had no medications administered during this visit. Current Outpatient Medications  Medication Sig Dispense Refill  . megestrol (MEGACE) 40 MG tablet Take 1 tablet (40 mg total) by mouth 2 (two) times daily. Can increase to two tablets twice a day in the event of heavy bleeding 60 tablet 5  . tranexamic acid (LYSTEDA) 650 MG TABS tablet Take 2 tablets (1,300 mg total) by mouth 3 (three) times daily. Take during menses for a maximum of five days 30 tablet 2   No current facility-administered medications for this visit.    Allergies Patient has no known allergies.   Physical Exam:  BP 120/84   Pulse (!) 114   Ht 5\' 1"  (1.549 m)   Wt 145 lb 9.6 oz (66 kg)   BMI 27.51 kg/m  Body mass index is 27.51 kg/m. General appearance: Well nourished, well developed female in no acute distress.  Cardiovascular: normal s1 and s2.  No murmurs, rubs or gallops. HR 100s Respiratory:  Clear to auscultation bilateral. Normal respiratory effort Abdomen: +BS, soft, nttp, nd. No hernias. Likely fibroid uterus to just below the umbilicus Neuro/Psych:  Normal mood and affect.  Skin:  Warm and dry.  Lymphatic:  No inguinal lymphadenopathy.    Laboratory: as per HPI.   Radiology:  Narrative & Impression  CLINICAL DATA:  Abnormal uterine bleeding  EXAM: TRANSABDOMINAL AND TRANSVAGINAL ULTRASOUND OF PELVIS  TECHNIQUE: Both transabdominal and transvaginal ultrasound examinations of the pelvis were performed. Transabdominal technique was performed for global imaging of the pelvis including uterus, ovaries, adnexal regions, and pelvic cul-de-sac. It  was necessary to proceed with endovaginal exam following the transabdominal exam to visualize the ovary.  COMPARISON:  July 11, 2019  FINDINGS: Uterus  Measurements: 10.9 x 8.6 x 9.7 cm = volume: 479 mL. There is a large posterior fundal uterine fibroid again noted measuring 7.3 x 6.7 x 6.9 cm which abuts the endometrium.  Endometrium  Thickness: 10.8 mm.  No focal abnormality visualized.  Right ovary  Measurements: 3.4 x 2.5 x 2.8 cm = volume: 12.9 mL. Probable dominant follicle or small ovarian cyst is seen.  Left ovary  Measurements: 4.0 x 1.5 x 2.6 cm = volume: 8.2 mL. Normal appearance/no adnexal mass.  Other findings  No abnormal free fluid.  IMPRESSION: Unchanged large posterior fundal uterine fibroid.  Endometrium measuring 10.8 mm, please correlate with patient's cycle.   Electronically Signed   By: Prudencio Pair M.D.   On: 01/10/2020 04:02     Assessment: pt stable  Plan:  1. Abnormal uterine bleeding I told her to do the megace 40mg  po bid and to stop the Lysteda, as we want that to be PRN and not continuous. I told her that based on her history I believe she's going to need surgical management to remove the fibroid, which looks submucosal on u/s, but I want to get an MRI first. Based on that, she may be eligible for l/s approach, which I told her I would refer her out for a surgical consult with a specialist to see if they agree with removal. Patient started on iron. Rpt cbc today - CBC - Comprehensive metabolic panel - MR PELVIS W CONTRAST; Future  2. Uterine leiomyoma, unspecified location - MR PELVIS W CONTRAST; Future  Orders Placed This Encounter  Procedures  . MR PELVIS W CONTRAST  . CBC  . Comprehensive metabolic panel    RTC d/w pt after MRI  Durene Romans MD Attending Center for [facility])

## 2020-03-18 ENCOUNTER — Observation Stay (HOSPITAL_COMMUNITY)
Admission: EM | Admit: 2020-03-18 | Discharge: 2020-03-19 | Disposition: A | Discharge status: Home / Self Care | Payer: Medicaid Other | Attending: Obstetrics and Gynecology | Admitting: Obstetrics and Gynecology

## 2020-03-18 ENCOUNTER — Other Ambulatory Visit: Payer: Self-pay

## 2020-03-18 ENCOUNTER — Encounter: Payer: Self-pay | Admitting: Obstetrics and Gynecology

## 2020-03-18 DIAGNOSIS — Z20822 Contact with and (suspected) exposure to covid-19: Secondary | ICD-10-CM | POA: Diagnosis not present

## 2020-03-18 DIAGNOSIS — D649 Anemia, unspecified: Principal | ICD-10-CM | POA: Diagnosis present

## 2020-03-18 LAB — I-STAT BETA HCG BLOOD, ED (MC, WL, AP ONLY): I-stat hCG, quantitative: <5 m[IU]/mL (ref <5)

## 2020-03-18 LAB — CBC
HCT: 18.4 % — ABNORMAL LOW (ref 36.0–46.0)
Hematocrit: 18 % — CL (ref 34.0–46.6)
Hemoglobin: 4.7 g/dL — CL (ref 11.1–15.9)
Hemoglobin: 4.6 g/dL — CL (ref 12.0–15.0)
MCH: 17.2 pg — ABNORMAL LOW (ref 26.6–33.0)
MCH: 16.9 pg — ABNORMAL LOW (ref 26.0–34.0)
MCHC: 26.1 g/dL — ABNORMAL LOW (ref 31.5–35.7)
MCHC: 25 g/dL — ABNORMAL LOW (ref 30.0–36.0)
MCV: 66 fL — ABNORMAL LOW (ref 79–97)
MCV: 67.6 fL — ABNORMAL LOW (ref 80.0–100.0)
Platelets: 867 10*3/uL (ref 150–450)
Platelets: 827 10*3/uL — ABNORMAL HIGH (ref 150–400)
RBC: 2.74 x10E6/uL — CL (ref 3.77–5.28)
RBC: 2.72 MIL/uL — ABNORMAL LOW (ref 3.87–5.11)
RDW: 21.2 % — ABNORMAL HIGH (ref 11.7–15.4)
RDW: 21.9 % — ABNORMAL HIGH (ref 11.5–15.5)
WBC: 6.9 10*3/uL (ref 3.4–10.8)
WBC: 6.6 10*3/uL (ref 4.0–10.5)
nRBC: 0.3 % — ABNORMAL HIGH (ref 0.0–0.2)

## 2020-03-18 LAB — COMPREHENSIVE METABOLIC PANEL
ALT: 14 IU/L (ref 0–32)
ALT: 14 U/L (ref 0–44)
AST: 10 IU/L (ref 0–40)
AST: 14 U/L — ABNORMAL LOW (ref 15–41)
Albumin/Globulin Ratio: 1.1 — ABNORMAL LOW (ref 1.2–2.2)
Albumin: 4.1 g/dL (ref 3.9–5.0)
Albumin: 3.3 g/dL — ABNORMAL LOW (ref 3.5–5.0)
Alkaline Phosphatase: 75 IU/L (ref 44–121)
Alkaline Phosphatase: 56 U/L (ref 38–126)
Anion gap: 10 (ref 5–15)
BUN/Creatinine Ratio: 13 (ref 9–23)
BUN: 7 mg/dL (ref 6–20)
BUN: 5 mg/dL — ABNORMAL LOW (ref 6–20)
Bilirubin Total: 0.3 mg/dL (ref 0.0–1.2)
CO2: 21 mmol/L (ref 20–29)
CO2: 22 mmol/L (ref 22–32)
Calcium: 9.3 mg/dL (ref 8.7–10.2)
Calcium: 8.9 mg/dL (ref 8.9–10.3)
Chloride: 103 mmol/L (ref 96–106)
Chloride: 106 mmol/L (ref 98–111)
Creatinine, Ser: 0.54 mg/dL — ABNORMAL LOW (ref 0.57–1.00)
Creatinine, Ser: 0.61 mg/dL (ref 0.44–1.00)
GFR calc Af Amer: 147 mL/min/{1.73_m2} (ref >59)
GFR calc non Af Amer: 128 mL/min/{1.73_m2} (ref >59)
GFR, Estimated: >60 mL/min (ref >60)
Globulin, Total: 3.9 g/dL (ref 1.5–4.5)
Glucose, Bld: 97 mg/dL (ref 70–99)
Glucose: 95 mg/dL (ref 65–99)
Potassium: 4.6 mmol/L (ref 3.5–5.2)
Potassium: 3.4 mmol/L — ABNORMAL LOW (ref 3.5–5.1)
Sodium: 138 mmol/L (ref 134–144)
Sodium: 138 mmol/L (ref 135–145)
Total Bilirubin: 0.1 mg/dL — ABNORMAL LOW (ref 0.3–1.2)
Total Protein: 8 g/dL (ref 6.0–8.5)
Total Protein: 7.7 g/dL (ref 6.5–8.1)

## 2020-03-18 LAB — RESPIRATORY PANEL BY RT PCR (FLU A&B, COVID)
Influenza A by PCR: NEGATIVE
Influenza B by PCR: NEGATIVE
SARS Coronavirus 2 by RT PCR: NEGATIVE

## 2020-03-18 LAB — PREPARE RBC (CROSSMATCH)

## 2020-03-18 MED ORDER — SODIUM CHLORIDE 0.9% IV SOLUTION: Freq: Once | INTRAVENOUS | Status: AC

## 2020-03-18 MED ORDER — PRENATAL MULTIVITAMIN CH
1.0000 | ORAL_TABLET | Freq: Every day | ORAL | Status: DC
  Administered 2020-03-18: 1 via ORAL
  Filled 2020-03-18 (×2): qty 1

## 2020-03-18 MED ORDER — SODIUM CHLORIDE 0.9 % IV SOLN
10.0000 mL/h | Freq: Once | INTRAVENOUS | Status: AC
  Administered 2020-03-18: 10 mL/h via INTRAVENOUS

## 2020-03-18 MED ORDER — MEGESTROL ACETATE 40 MG PO TABS
40.0000 mg | ORAL_TABLET | Freq: Two times a day (BID) | ORAL | Status: DC
  Administered 2020-03-18 (×2): 40 mg via ORAL
  Filled 2020-03-18 (×4): qty 1

## 2020-03-18 NOTE — H&P (Signed)
Deborah Craig is an 29 y.o. female P20 with fibroid uterus and know history of AUB presenting to the ED for management of anemia. Patient was seen in the office yesterday for follow up on AUB which was previously managed with megace and lysteda. Medical management changed to megace only. Patient denies any vaginal bleeding over the past two days. She denies chest pain or shortness of breath. She denies lightheadedness/dizziness. She reports some generalized weakness/fatigue       Past Medical History:  Diagnosis Date  . Abnormal uterine bleeding (AUB) 07/11/2019  . Anemia 07/11/2019   SYMPTOMATIC  . Iron deficiency anemia 07/11/2019  . Uterine leiomyoma 08/05/2019    Past Surgical History:  Procedure Laterality Date  . WISDOM TOOTH EXTRACTION      Family History  Problem Relation Age of Onset  . Diabetes Mother   . Hypertension Mother     Social History:  reports that she has never smoked. She has never used smokeless tobacco. She reports that she does not drink alcohol and does not use drugs.  Allergies: No Known Allergies  (Not in a hospital admission)   Review of Systems See pertinent in HPI. All other systems reviewed and non contributory Blood pressure 113/64, pulse 95, temperature 98.8 F (37.1 C), temperature source Oral, resp. rate 16, height 5\' 1"  (1.549 m), weight 63.5 kg, SpO2 100 %. Physical Exam GENERAL: Well-developed, well-nourished female in no acute distress.  LUNGS: Clear to auscultation bilaterally.  HEART: Regular rate and rhythm. ABDOMEN: Soft, nontender, nondistended.  PELVIC: Deferred EXTREMITIES: No cyanosis, clubbing, or edema, 2+ distal pulses.  Results for orders placed or performed during the hospital encounter of 03/18/20 (from the past 24 hour(s))  Type and screen Regino Ramirez     Status: None (Preliminary result)   Collection Time: 03/18/20 11:09 AM  Result Value Ref Range   ABO/RH(D) O POS    Antibody Screen NEG    Sample  Expiration 03/21/2020,2359    Unit Number [id number]    Blood Component Type RED CELLS,LR    Unit division 00    Status of Unit ISSUED    Transfusion Status OK TO TRANSFUSE    Crossmatch Result      Compatible Performed at [facility], [address], [place], [place] [address]    Unit Number [id number]    Blood Component Type RED CELLS,LR    Unit division 00    Status of Unit ALLOCATED    Transfusion Status OK TO TRANSFUSE    Crossmatch Result Compatible   Comprehensive metabolic panel     Status: Abnormal   Collection Time: 03/18/20 11:10 AM  Result Value Ref Range   Sodium 138 135 - 145 mmol/L   Potassium 3.4 (L) 3.5 - 5.1 mmol/L   Chloride 106 98 - 111 mmol/L   CO2 22 22 - 32 mmol/L   Glucose, Bld 97 70 - 99 mg/dL   BUN 5 (L) 6 - 20 mg/dL   Creatinine, Ser 0.61 0.44 - 1.00 mg/dL   Calcium 8.9 8.9 - 10.3 mg/dL   Total Protein 7.7 6.5 - 8.1 g/dL   Albumin 3.3 (L) 3.5 - 5.0 g/dL   AST 14 (L) 15 - 41 U/L   ALT 14 0 - 44 U/L   Alkaline Phosphatase 56 38 - 126 U/L   Total Bilirubin 0.1 (L) 0.3 - 1.2 mg/dL   GFR, Estimated >60 >60 mL/min   Anion gap 10 5 - 15  CBC  Status: Abnormal   Collection Time: 03/18/20 11:10 AM  Result Value Ref Range   WBC 6.6 4.0 - 10.5 K/uL   RBC 2.72 (L) 3.87 - 5.11 MIL/uL   Hemoglobin 4.6 (LL) 12.0 - 15.0 g/dL   HCT 18.4 (L) 36 - 46 %   MCV 67.6 (L) 80.0 - 100.0 fL   MCH 16.9 (L) 26.0 - 34.0 pg   MCHC 25.0 (L) 30.0 - 36.0 g/dL   RDW 21.9 (H) 11.5 - 15.5 %   Platelets 827 (H) 150 - 400 K/uL   nRBC 0.3 (H) 0.0 - 0.2 %  I-Stat beta hCG blood, ED     Status: None   Collection Time: 03/18/20 11:32 AM  Result Value Ref Range   I-stat hCG, quantitative <5.0 <5 mIU/mL   Comment 3          Prepare RBC (crossmatch)     Status: None   Collection Time: 03/18/20 12:21 PM  Result Value Ref Range   Order Confirmation      ORDER PROCESSED BY BLOOD BANK Performed at [facility], [address] [place], [place] [address]    Prepare RBC (crossmatch)     Status: None   Collection Time: 03/18/20  1:30 PM  Result Value Ref Range   Order Confirmation      ORDER PROCESSED BY BLOOD BANK Performed at [facility], [place] [address], [place], [place] [address]     No results found.  Assessment/Plan: 29 yo with fibroid uterus, AUB and symptomatic anemia - Patient admitted for blood transfusion - Will continue Megace  Caitlain Tweed 03/18/2020, 1:01 PM

## 2020-03-18 NOTE — ED Triage Notes (Signed)
Pt here sent by GYN for blood transfusion, hgb 4.7 yesterday at the office. Vaginal bleeding x 1 month, hx of fibroids.

## 2020-03-18 NOTE — ED Notes (Signed)
Dr. Tamera Punt aware of pt's critical hemoglobin of 4.6.

## 2020-03-18 NOTE — ED Notes (Signed)
Verbal consent was given to this RN by the pt before the first bag of blood was given, written consent was just obtained.

## 2020-03-18 NOTE — ED Provider Notes (Signed)
[facility] EMERGENCY DEPARTMENT Provider Note   CSN: [id number] Arrival date & time: 03/18/20  1045     History Chief Complaint  Patient presents with  . Abnormal Lab    Deborah Craig is a 29 y.o. female.  Patient is a 29 year old female with a history of uterine fibroids and heavy vaginal bleeding who presents with low hemoglobin.  She has had to have blood transfusions in the past.  She saw her OB/GYN yesterday and was noted to have a low hemoglobin in the fours.  She overall does not feel bad.  She has some slight pain in her left flank.  She has not had any vaginal bleeding in the last 2 days.  No dizziness no fevers.  No nausea or vomiting.  She was sent here by the OB/GYN for blood transfusion.  She is currently taking Megace.        Past Medical History:  Diagnosis Date  . Abnormal uterine bleeding (AUB) 07/11/2019  . Anemia 07/11/2019   SYMPTOMATIC  . Iron deficiency anemia 07/11/2019  . Uterine leiomyoma 08/05/2019    Patient Active Problem List   Diagnosis Date Noted  . Abnormal uterine bleeding 01/10/2020  . Thrombocytosis 08/09/2019  . Uterine leiomyoma 08/05/2019  . Abnormal uterine bleeding (AUB) 07/11/2019  . Bacterial vaginitis 07/11/2019  . Symptomatic anemia 07/11/2019  . Iron deficiency anemia 07/11/2019    Past Surgical History:  Procedure Laterality Date  . WISDOM TOOTH EXTRACTION       OB History    Gravida  1   Para  1   Term      Preterm  1   AB      Living  1     SAB      TAB      Ectopic      Multiple      Live Births  1           Family History  Problem Relation Age of Onset  . Diabetes Mother   . Hypertension Mother     Social History   Tobacco Use  . Smoking status: Never Smoker  . Smokeless tobacco: Never Used  Vaping Use  . Vaping Use: Never used  Substance Use Topics  . Alcohol use: No  . Drug use: No    Home Medications Prior to Admission medications   Medication Sig Start  Date End Date Taking? Authorizing Provider  docusate sodium (COLACE) 100 MG capsule Take 1 capsule (100 mg total) by mouth 2 (two) times daily as needed. 03/17/20   Aletha Halim, MD  Iron Polysacch Cmplx-B12-FA 150-0.025-1 MG CAPS Take 1 tablet by mouth daily. 03/17/20   Aletha Halim, MD  megestrol (MEGACE) 40 MG tablet Take 1 tablet (40 mg total) by mouth 2 (two) times daily. Can increase to two tablets twice a day in the event of heavy bleeding 02/10/20   Anyanwu, Sallyanne Havers, MD    Allergies    Patient has no known allergies.  Review of Systems   Review of Systems  Constitutional: Negative for chills, diaphoresis, fatigue and fever.  HENT: Negative for congestion, rhinorrhea and sneezing.   Eyes: Negative.   Respiratory: Negative for cough, chest tightness and shortness of breath.   Cardiovascular: Negative for chest pain and leg swelling.  Gastrointestinal: Negative for abdominal pain, blood in stool, diarrhea, nausea and vomiting.  Genitourinary: Positive for vaginal bleeding. Negative for difficulty urinating, flank pain, frequency, hematuria and vaginal discharge.  Musculoskeletal:  Negative for arthralgias and back pain.  Skin: Negative for rash.  Neurological: Negative for dizziness, speech difficulty, weakness, numbness and headaches.    Physical Exam Updated Vital Signs BP 113/64   Pulse 95   Temp 98.8 F (37.1 C) (Oral)   Resp 16   Ht 5\' 1"  (1.549 m)   Wt 63.5 kg   SpO2 100%   BMI 26.45 kg/m   Physical Exam Constitutional:      Appearance: She is well-developed.  HENT:     Head: Normocephalic and atraumatic.  Eyes:     Pupils: Pupils are equal, round, and reactive to light.  Cardiovascular:     Rate and Rhythm: Normal rate and regular rhythm.     Heart sounds: Normal heart sounds.  Pulmonary:     Effort: Pulmonary effort is normal. No respiratory distress.     Breath sounds: Normal breath sounds. No wheezing or rales.  Chest:     Chest wall: No  tenderness.  Abdominal:     General: Bowel sounds are normal.     Palpations: Abdomen is soft.     Tenderness: There is no abdominal tenderness. There is no guarding or rebound.  Musculoskeletal:        General: Normal range of motion.     Cervical back: Normal range of motion and neck supple.  Lymphadenopathy:     Cervical: No cervical adenopathy.  Skin:    General: Skin is warm and dry.     Findings: No rash.  Neurological:     Mental Status: She is alert and oriented to person, place, and time.     ED Results / Procedures / Treatments   Labs (all labs ordered are listed, but only abnormal results are displayed) Labs Reviewed  COMPREHENSIVE METABOLIC PANEL - Abnormal; Notable for the following components:      Result Value   Potassium 3.4 (*)    BUN 5 (*)    Albumin 3.3 (*)    AST 14 (*)    Total Bilirubin 0.1 (*)    All other components within normal limits  CBC - Abnormal; Notable for the following components:   RBC 2.72 (*)    Hemoglobin 4.6 (*)    HCT 18.4 (*)    MCV 67.6 (*)    MCH 16.9 (*)    MCHC 25.0 (*)    RDW 21.9 (*)    Platelets 827 (*)    nRBC 0.3 (*)    All other components within normal limits  RESPIRATORY PANEL BY RT PCR (FLU A&B, COVID)  I-STAT BETA HCG BLOOD, ED ([facility], [facility], [facility] ONLY)  TYPE AND SCREEN  PREPARE RBC (CROSSMATCH)    EKG None  Radiology No results found.  Procedures Procedures (including critical care time)  Medications Ordered in ED Medications  0.9 %  sodium chloride infusion (has no administration in time range)    ED Course  I have reviewed the triage vital signs and the nursing notes.  Pertinent labs & imaging results that were available during my care of the patient were reviewed by me and considered in my medical decision making (see chart for details).    MDM Rules/Calculators/A&P                         Patient with a markedly low hemoglobin at 4.6.  She was typed and crossed for 2 units of packed red cells.  I  spoke with Dr. Elly Modena with OB/GYN  who will admit the patient for further treatment. Final Clinical Impression(s) / ED Diagnoses Final diagnoses:  Symptomatic anemia    Rx / DC Orders ED Discharge Orders    None       Malvin Johns, MD 03/18/20 1226

## 2020-03-18 NOTE — Progress Notes (Signed)
Received phone call from [employer] with critical valve of  CBC. H & 4.7/18.0 Attempted to reach pt via mobile number in chart. VM left reviewing need for admission for blood transfusion and instructions for her to present to [facility] for admission.

## 2020-03-19 DIAGNOSIS — D649 Anemia, unspecified: Secondary | ICD-10-CM | POA: Diagnosis not present

## 2020-03-19 DIAGNOSIS — N939 Abnormal uterine and vaginal bleeding, unspecified: Secondary | ICD-10-CM

## 2020-03-19 DIAGNOSIS — D259 Leiomyoma of uterus, unspecified: Secondary | ICD-10-CM

## 2020-03-19 LAB — HEMOGLOBIN AND HEMATOCRIT, BLOOD
HCT: 38.2 % (ref 36.0–46.0)
Hemoglobin: 12.2 g/dL (ref 12.0–15.0)

## 2020-03-19 NOTE — Discharge Instructions (Signed)
Anemia  Anemia is a condition in which you do not have enough red blood cells or hemoglobin. Hemoglobin is a substance in red blood cells that carries oxygen. When you do not have enough red blood cells or hemoglobin (are anemic), your body cannot get enough oxygen and your organs may not work properly. As a result, you may feel very tired or have other problems. What are the causes? Common causes of anemia include:  Excessive bleeding. Anemia can be caused by excessive bleeding inside or outside the body, including bleeding from the intestine or from periods in women.  Poor nutrition.  Long-lasting (chronic) kidney, thyroid, and liver disease.  Bone marrow disorders.  Cancer and treatments for cancer.  HIV (human immunodeficiency virus) and AIDS (acquired immunodeficiency syndrome).  Treatments for HIV and AIDS.  Spleen problems.  Blood disorders.  Infections, medicines, and autoimmune disorders that destroy red blood cells. What are the signs or symptoms? Symptoms of this condition include:  Minor weakness.  Dizziness.  Headache.  Feeling heartbeats that are irregular or faster than normal (palpitations).  Shortness of breath, especially with exercise.  Paleness.  Cold sensitivity.  Indigestion.  Nausea.  Difficulty sleeping.  Difficulty concentrating. Symptoms may occur suddenly or develop slowly. If your anemia is mild, you may not have symptoms. How is this diagnosed? This condition is diagnosed based on:  Blood tests.  Your medical history.  A physical exam.  Bone marrow biopsy. Your health care provider may also check your stool (feces) for blood and may do additional testing to look for the cause of your bleeding. You may also have other tests, including:  Imaging tests, such as a CT scan or MRI.  Endoscopy.  Colonoscopy. How is this treated? Treatment for this condition depends on the cause. If you continue to lose a lot of blood, you may  need to be treated at a hospital. Treatment may include:  Taking supplements of iron, vitamin S31, or folic acid.  Taking a hormone medicine (erythropoietin) that can help to stimulate red blood cell growth.  Having a blood transfusion. This may be needed if you lose a lot of blood.  Making changes to your diet.  Having surgery to remove your spleen. Follow these instructions at home:  Take over-the-counter and prescription medicines only as told by your health care provider.  Take supplements only as told by your health care provider.  Follow any diet instructions that you were given.  Keep all follow-up visits as told by your health care provider. This is important. Contact a health care provider if:  You develop new bleeding anywhere in the body. Get help right away if:  You are very weak.  You are short of breath.  You have pain in your abdomen or chest.  You are dizzy or feel faint.  You have trouble concentrating.  You have bloody or black, tarry stools.  You vomit repeatedly or you vomit up blood. Summary  Anemia is a condition in which you do not have enough red blood cells or enough of a substance in your red blood cells that carries oxygen (hemoglobin).  Symptoms may occur suddenly or develop slowly.  If your anemia is mild, you may not have symptoms.  This condition is diagnosed with blood tests as well as a medical history and physical exam. Other tests may be needed.  Treatment for this condition depends on the cause of the anemia. This information is not intended to replace advice given to you by  your health care provider. Make sure you discuss any questions you have with your health care provider. Document Revised: 05/09/2017 Document Reviewed: 06/28/2016 Elsevier Patient Education  [place].

## 2020-03-19 NOTE — Discharge Summary (Addendum)
Physician Discharge Summary  Patient ID: Deborah Craig MRN: [id number] DOB/AGE: March 09, 1991 29 y.o.  Admit date: 03/18/2020 Discharge date: 03/19/2020  Admission Diagnoses:  Discharge Diagnoses:  Active Problems:   Symptomatic anemia   Discharged Condition: good  Hospital Course: Patient admitted with symptomatic anemia due to AUB associated with fibroid uterus. Patient received 3 units pRBC with improvement in her symptoms. Patient is not currently bleeding and is currently managed with megace  Consults: None  Treatments: 3 units pRBC with improvement of Hg 12.2 from 4.4  Discharge Exam: Blood pressure 118/78, pulse 92, temperature 98.3 F (36.8 C), temperature source Oral, resp. rate 17, height 5\' 1"  (1.549 m), weight 63.5 kg, SpO2 98 %. GENERAL: Well-developed, well-nourished female in no acute distress.  LUNGS: Clear to auscultation bilaterally.  HEART: Regular rate and rhythm. ABDOMEN: Soft, nontender, nondistended. No organomegaly. EXTREMITIES: No cyanosis, clubbing, or edema, 2+ distal pulses.   Disposition: home Less than 20 minutes needed to discharge patient   Allergies as of 03/19/2020   No Known Allergies     Medication List    TAKE these medications   docusate sodium 100 MG capsule Commonly known as: COLACE Take 1 capsule (100 mg total) by mouth 2 (two) times daily as needed. What changed: reasons to take this   Iron Polysacch Cmplx-B12-FA 150-0.025-1 MG Caps Take 1 tablet by mouth daily.   megestrol 40 MG tablet Commonly known as: MEGACE Take 1 tablet (40 mg total) by mouth 2 (two) times daily. Can increase to two tablets twice a day in the event of heavy bleeding       Follow-up [place] [facility] Follow up.   Specialty: Obstetrics and Gynecology Why: An appointment will be made for you to follow up with Dr. Ilda Basset following MRI Contact information: [place] [address] [phone]              Signed: Mora Bellman 03/19/2020, 8:04 AM

## 2020-03-20 LAB — TYPE AND SCREEN
ABO/RH(D): O POS
Antibody Screen: NEGATIVE
Unit division: 0
Unit division: 0
Unit division: 0
Unit division: 0
Unit division: 0

## 2020-03-20 LAB — BPAM RBC
Blood Product Expiration Date: 202111052359
Blood Product Expiration Date: 202111052359
Blood Product Expiration Date: 202111052359
Blood Product Expiration Date: 202111052359
Blood Product Expiration Date: 202111052359
ISSUE DATE / TIME: 202110091252
ISSUE DATE / TIME: 202110091459
ISSUE DATE / TIME: 202110091634
ISSUE DATE / TIME: 202110092119
ISSUE DATE / TIME: 202110100015
Unit Type and Rh: 5100
Unit Type and Rh: 5100
Unit Type and Rh: 5100
Unit Type and Rh: 5100
Unit Type and Rh: 5100

## 2020-03-28 ENCOUNTER — Other Ambulatory Visit: Payer: Self-pay | Admitting: Obstetrics and Gynecology

## 2020-03-28 DIAGNOSIS — D259 Leiomyoma of uterus, unspecified: Secondary | ICD-10-CM

## 2020-03-28 DIAGNOSIS — N939 Abnormal uterine and vaginal bleeding, unspecified: Secondary | ICD-10-CM

## 2020-04-05 ENCOUNTER — Other Ambulatory Visit: Payer: Self-pay

## 2020-04-06 ENCOUNTER — Other Ambulatory Visit: Payer: Self-pay | Admitting: Obstetrics and Gynecology

## 2020-04-07 ENCOUNTER — Ambulatory Visit (HOSPITAL_COMMUNITY): Admission: RE | Admit: 2020-04-07 | Payer: Medicaid Other | Source: Ambulatory Visit

## 2020-04-13 ENCOUNTER — Encounter: Payer: Self-pay | Admitting: Lactation Services

## 2020-04-13 ENCOUNTER — Ambulatory Visit (HOSPITAL_COMMUNITY): Admission: RE | Admit: 2020-04-13 | Payer: Medicaid Other | Source: Ambulatory Visit

## 2020-04-13 ENCOUNTER — Telehealth: Payer: Self-pay

## 2020-04-13 MED ORDER — IRON POLYSACCH CMPLX-B12-FA 150-0.025-1 MG PO CAPS: 1.0000 | ORAL_CAPSULE | Freq: Every day | ORAL | 0 refills | Status: DC

## 2020-04-13 NOTE — Telephone Encounter (Signed)
Called pt and informed pt that we have moved up her MRI appt to 04/15/20 @ 1430.  So that she would be able to have her MRI before her appt with the provider on the 04/19/20 and that the provider does not need to be rescheduled. I informed her that the prior auth should taken care of.   Pt stated "thank you" and verbalized understanding.  Mel Almond, RN  04/14/20

## 2020-04-13 NOTE — Telephone Encounter (Signed)
Called patient to let her know that her Iron supplement has been refilled. Patient did not answer. LM that prescription has been sent to the pharmacy.

## 2020-04-15 ENCOUNTER — Other Ambulatory Visit: Payer: Self-pay

## 2020-04-15 ENCOUNTER — Ambulatory Visit (HOSPITAL_COMMUNITY)
Admission: RE | Admit: 2020-04-15 | Discharge: 2020-04-15 | Disposition: A | Discharge status: Home / Self Care | Payer: Medicaid Other | Source: Ambulatory Visit | Attending: Obstetrics and Gynecology | Admitting: Obstetrics and Gynecology

## 2020-04-15 DIAGNOSIS — D259 Leiomyoma of uterus, unspecified: Secondary | ICD-10-CM | POA: Insufficient documentation

## 2020-04-15 DIAGNOSIS — N939 Abnormal uterine and vaginal bleeding, unspecified: Secondary | ICD-10-CM | POA: Diagnosis not present

## 2020-04-15 DIAGNOSIS — D251 Intramural leiomyoma of uterus: Secondary | ICD-10-CM | POA: Diagnosis not present

## 2020-04-15 DIAGNOSIS — Z01818 Encounter for other preprocedural examination: Secondary | ICD-10-CM | POA: Diagnosis not present

## 2020-04-15 MED ORDER — GADOBUTROL 1 MMOL/ML IV SOLN
6.5000 mL | Freq: Once | INTRAVENOUS | Status: AC | PRN
  Administered 2020-04-15: 6.5 mL via INTRAVENOUS

## 2020-04-19 ENCOUNTER — Other Ambulatory Visit: Payer: Self-pay

## 2020-04-19 ENCOUNTER — Ambulatory Visit (INDEPENDENT_AMBULATORY_CARE_PROVIDER_SITE_OTHER): Payer: Medicaid Other | Admitting: Obstetrics and Gynecology

## 2020-04-19 ENCOUNTER — Encounter: Payer: Self-pay | Admitting: Obstetrics and Gynecology

## 2020-04-19 VITALS — BP 125/78 | HR 101 | Ht 61.0 in | Wt 154.0 lb

## 2020-04-19 DIAGNOSIS — D259 Leiomyoma of uterus, unspecified: Secondary | ICD-10-CM | POA: Diagnosis not present

## 2020-04-19 DIAGNOSIS — N939 Abnormal uterine and vaginal bleeding, unspecified: Secondary | ICD-10-CM

## 2020-04-19 MED ORDER — MEGESTROL ACETATE 40 MG PO TABS: 80.0000 mg | ORAL_TABLET | Freq: Two times a day (BID) | ORAL | 5 refills | Status: DC

## 2020-04-19 NOTE — Progress Notes (Signed)
Obstetrics and Gynecology Visit Return Patient Evaluation  Appointment Date: 04/19/2020  Primary Care Provider: Patient, No Pcp Per  OBGYN Clinic: [facility]  Chief Complaint: follow up MRI, AUB, fibroids  History of Present Illness:  Deborah Craig is a 29 y.o. G1P1 with above CCo. PMhx significant for fibroids, h/o anemia and blood transfusions and bmi 20s.  Patient most recently admitted on 10/9 for blood transfusion for Hgb of 4.6 and increase to 12 after 3U PRBCs.   Interval History: Since that time, she states that she is taking the megace 40 qday and that the megace helps. She is currently going through about 3 pads non saturated per day.   MRI results are below  Review of Systems:  her 12 point review of systems is negative or as noted in the History of Present Illness.  Patient Active Problem List   Diagnosis Date Noted  . Abnormal uterine bleeding 01/10/2020  . Thrombocytosis 08/09/2019  . Uterine leiomyoma 08/05/2019  . Abnormal uterine bleeding (AUB) 07/11/2019  . Bacterial vaginitis 07/11/2019  . Symptomatic anemia 07/11/2019  . Iron deficiency anemia 07/11/2019   Medications:  Michelle Umholtz had no medications administered during this visit. Current Outpatient Medications  Medication Sig Dispense Refill  . docusate sodium (COLACE) 100 MG capsule Take 1 capsule (100 mg total) by mouth 2 (two) times daily as needed. (Patient taking differently: Take 100 mg by mouth 2 (two) times daily as needed for mild constipation. ) 30 capsule 2  . Iron Polysacch Cmplx-B12-FA 150-0.025-1 MG CAPS Take 1 tablet by mouth daily. 60 capsule 0  . megestrol (MEGACE) 40 MG tablet Take 2 tablets (80 mg total) by mouth 2 (two) times daily. Can increase to two tablets twice a day in the event of heavy bleeding 60 tablet 5   No current facility-administered medications for this visit.    Allergies: has No Known Allergies.  Physical Exam:  BP 125/78    Pulse (!) 101   Ht 5\' 1"  (1.549 m)   Wt 154 lb (69.9 kg)   BMI 29.10 kg/m  Body mass index is 29.1 kg/m. General appearance: Well nourished, well developed female in no acute distress.  Neuro/Psych:  Normal mood and affect.    Radiology Narrative & Impression  CLINICAL DATA:  Uterine fibroids.  Preoperative planning.  EXAM: MRI PELVIS WITHOUT AND WITH CONTRAST  TECHNIQUE: Multiplanar multisequence MR imaging of the pelvis was performed both before and after administration of intravenous contrast.  CONTRAST:  6.33mL GADAVIST GADOBUTROL 1 MMOL/ML IV SOLN  COMPARISON:  None.  FINDINGS: Lower Urinary Tract: No bladder or urethral abnormality identified.  Bowel:  Unremarkable visualized pelvic bowel loops.  Vascular/Lymphatic: No pathologically enlarged lymph nodes or other significant abnormality.  Reproductive:  -- Uterus: Measures 12.9 x 9.4 x 10.3 cm (volume = 650 cm^3). A large fibroid is seen arising in the left posterior wall of the upper uterine corpus which has both submucosal and intracavitary components. This fibroid measures 7.0 x 6.7 by 7.0 cm. A 1.0 cm intramural fibroid is also seen in the left anterior lower uterine segment. Cervix is normal in appearance.  -- Intracavitary fibroids:  As described above  -- Pedunculated fibroids: None.  -- Fibroid contrast enhancement: The 7 cm submucosal an intracavitary fibroid described above shows diffuse red degeneration, with lack of significant internal contrast enhancement.  -- Right ovary:  Appears normal.  No mass identified.  -- Left ovary:  Appears normal.  No mass identified.  Other: No abnormal free fluid.  Musculoskeletal:  Unremarkable.  IMPRESSION: 7 cm fibroid in the left posterior upper uterine corpus, which has both submucosal and intracavitary components. This fibroid shows diffuse red degeneration, without significant internal contrast enhancement.  1.0 cm intramural  fibroid in the left anterior lower uterine segment.  Normal appearance of both ovaries. No adnexal mass identified.   Electronically Signed   By: Marlaine Hind M.D.   On: 04/17/2020 09:25     Assessment: pt stable  Plan:  1. Abnormal uterine bleeding I d/w her that based on MRI I believe that I can do a hysteroscopic myomectomy. I told her I recommend some sort of GnRH medication to decrease size of fibroid, decrease/stop her bleeding in order to make the surgery technically less difficult and to help with her blood counts. In the interim, I recommend she increase to megace 80 bid while we see about getting coverage for her to get Oriahn vs Lupron and the better efficacy for either or. I d/w her re: risks with it and she is amenable with it. I will call her and let her know.  - DG DXA FRACTURE ASSESSMENT; Future  2. Uterine leiomyoma, unspecified location - DG DXA FRACTURE ASSESSMENT; Future  3. Abnormal uterine bleeding (AUB) - megestrol (MEGACE) 40 MG tablet; Take 2 tablets (80 mg total) by mouth 2 (two) times daily. Can increase to two tablets twice a day in the event of heavy bleeding  Dispense: 60 tablet; Refill: 5   Durene Romans MD Attending Center for [facility])

## 2020-04-22 ENCOUNTER — Ambulatory Visit (HOSPITAL_COMMUNITY): Payer: Medicaid Other

## 2020-04-24 ENCOUNTER — Other Ambulatory Visit: Payer: Self-pay

## 2020-04-24 ENCOUNTER — Telehealth: Payer: Self-pay

## 2020-04-24 ENCOUNTER — Encounter: Payer: Self-pay | Admitting: *Deleted

## 2020-04-24 ENCOUNTER — Ambulatory Visit (INDEPENDENT_AMBULATORY_CARE_PROVIDER_SITE_OTHER): Payer: Medicaid Other | Admitting: *Deleted

## 2020-04-24 VITALS — BP 120/71 | HR 103 | Ht 61.0 in | Wt 158.8 lb

## 2020-04-24 DIAGNOSIS — N939 Abnormal uterine and vaginal bleeding, unspecified: Secondary | ICD-10-CM

## 2020-04-24 DIAGNOSIS — N920 Excessive and frequent menstruation with regular cycle: Secondary | ICD-10-CM | POA: Diagnosis not present

## 2020-04-24 DIAGNOSIS — D259 Leiomyoma of uterus, unspecified: Secondary | ICD-10-CM

## 2020-04-24 MED ORDER — LEUPROLIDE ACETATE (3 MONTH) 11.25 MG IM KIT
11.2500 mg | PACK | Freq: Once | INTRAMUSCULAR | Status: AC
  Administered 2020-04-24: 11.25 mg via INTRAMUSCULAR

## 2020-04-24 NOTE — Progress Notes (Signed)
Pt presents for initial injection of Lupron depo. She is currently taking Megace 80 mg twice daily and is unsure of when to stop taking. I advised that I will send message to Dr. Ilda Basset and notify her when a response has been received. Lupron depo 11.25 mg IM administered. Pt tolerated well. Next dose due 1/31-2/14/22. Pt voiced understanding of all information given.

## 2020-04-24 NOTE — Telephone Encounter (Signed)
Per Ilda Basset, pt needs to get a Depo Lupron 11.25 mg injection.  Spoke with patient and patient informed me that she has an appt scheduled today on 04/24/20 @ 1330.  Pt stated that she will be here at the appt.    Mel Almond, RN  04/24/20

## 2020-04-26 NOTE — Progress Notes (Signed)
Patient was assessed and managed by nursing staff during this encounter. I have reviewed the chart and agree with the documentation and plan. I have also made any necessary editorial changes.  Aletha Halim, MD 04/26/2020 11:39 AM

## 2020-07-05 ENCOUNTER — Other Ambulatory Visit: Payer: Self-pay | Admitting: Obstetrics and Gynecology

## 2020-07-06 ENCOUNTER — Other Ambulatory Visit: Payer: Self-pay

## 2020-07-06 ENCOUNTER — Encounter (HOSPITAL_BASED_OUTPATIENT_CLINIC_OR_DEPARTMENT_OTHER): Payer: Self-pay | Admitting: Obstetrics and Gynecology

## 2020-07-08 ENCOUNTER — Other Ambulatory Visit (HOSPITAL_COMMUNITY): Payer: Medicaid Other

## 2020-07-10 ENCOUNTER — Other Ambulatory Visit: Payer: Self-pay

## 2020-07-10 ENCOUNTER — Ambulatory Visit (INDEPENDENT_AMBULATORY_CARE_PROVIDER_SITE_OTHER): Payer: Medicaid Other

## 2020-07-10 ENCOUNTER — Other Ambulatory Visit (HOSPITAL_COMMUNITY)
Admission: RE | Admit: 2020-07-10 | Discharge: 2020-07-10 | Disposition: A | Discharge status: Home / Self Care | Payer: Medicaid Other | Source: Ambulatory Visit | Attending: Obstetrics and Gynecology | Admitting: Obstetrics and Gynecology

## 2020-07-10 VITALS — BP 130/74 | HR 96 | Ht 61.0 in | Wt 152.7 lb

## 2020-07-10 DIAGNOSIS — Z20822 Contact with and (suspected) exposure to covid-19: Secondary | ICD-10-CM | POA: Insufficient documentation

## 2020-07-10 DIAGNOSIS — D259 Leiomyoma of uterus, unspecified: Secondary | ICD-10-CM

## 2020-07-10 DIAGNOSIS — N939 Abnormal uterine and vaginal bleeding, unspecified: Secondary | ICD-10-CM

## 2020-07-10 DIAGNOSIS — Z01812 Encounter for preprocedural laboratory examination: Secondary | ICD-10-CM | POA: Insufficient documentation

## 2020-07-10 LAB — SARS CORONAVIRUS 2 (TAT 6-24 HRS): SARS Coronavirus 2: NEGATIVE

## 2020-07-10 MED ORDER — LEUPROLIDE ACETATE (3 MONTH) 11.25 MG IM KIT
11.2500 mg | PACK | Freq: Once | INTRAMUSCULAR | Status: AC
  Administered 2020-07-10: 11.25 mg via INTRAMUSCULAR

## 2020-07-10 NOTE — Progress Notes (Signed)
Deborah Craig here for Depo Lupron Injection. Injection administered without complication. Pt scheduled for surgery on 07/12/2020 and will not need any further injections. Pt aware and verbalized understanding.    Georgia Lopes, RN 07/10/2020  1:40 PM

## 2020-07-11 ENCOUNTER — Other Ambulatory Visit: Payer: Self-pay | Admitting: Obstetrics and Gynecology

## 2020-07-11 ENCOUNTER — Encounter: Payer: Self-pay | Admitting: Obstetrics and Gynecology

## 2020-07-11 ENCOUNTER — Encounter (HOSPITAL_BASED_OUTPATIENT_CLINIC_OR_DEPARTMENT_OTHER)
Admission: RE | Admit: 2020-07-11 | Discharge: 2020-07-11 | Disposition: A | Discharge status: Home / Self Care | Payer: Medicaid Other | Source: Ambulatory Visit | Attending: Obstetrics and Gynecology | Admitting: Obstetrics and Gynecology

## 2020-07-11 DIAGNOSIS — Z833 Family history of diabetes mellitus: Secondary | ICD-10-CM | POA: Diagnosis not present

## 2020-07-11 DIAGNOSIS — D500 Iron deficiency anemia secondary to blood loss (chronic): Secondary | ICD-10-CM | POA: Diagnosis not present

## 2020-07-11 DIAGNOSIS — Z793 Long term (current) use of hormonal contraceptives: Secondary | ICD-10-CM | POA: Diagnosis not present

## 2020-07-11 DIAGNOSIS — N840 Polyp of corpus uteri: Secondary | ICD-10-CM | POA: Diagnosis not present

## 2020-07-11 DIAGNOSIS — Z01812 Encounter for preprocedural laboratory examination: Secondary | ICD-10-CM | POA: Insufficient documentation

## 2020-07-11 DIAGNOSIS — N939 Abnormal uterine and vaginal bleeding, unspecified: Secondary | ICD-10-CM | POA: Diagnosis not present

## 2020-07-11 DIAGNOSIS — Z79899 Other long term (current) drug therapy: Secondary | ICD-10-CM | POA: Diagnosis not present

## 2020-07-11 DIAGNOSIS — Z8249 Family history of ischemic heart disease and other diseases of the circulatory system: Secondary | ICD-10-CM | POA: Diagnosis not present

## 2020-07-11 DIAGNOSIS — D250 Submucous leiomyoma of uterus: Secondary | ICD-10-CM | POA: Diagnosis present

## 2020-07-11 LAB — CBC
HCT: 28.7 % — ABNORMAL LOW (ref 36.0–46.0)
Hemoglobin: 7.6 g/dL — ABNORMAL LOW (ref 12.0–15.0)
MCH: 17.4 pg — ABNORMAL LOW (ref 26.0–34.0)
MCHC: 26.5 g/dL — ABNORMAL LOW (ref 30.0–36.0)
MCV: 65.8 fL — ABNORMAL LOW (ref 80.0–100.0)
Platelets: 653 10*3/uL — ABNORMAL HIGH (ref 150–400)
RBC: 4.36 MIL/uL (ref 3.87–5.11)
RDW: 18.6 % — ABNORMAL HIGH (ref 11.5–15.5)
WBC: 5.7 10*3/uL (ref 4.0–10.5)
nRBC: 0 % (ref 0.0–0.2)

## 2020-07-11 LAB — TYPE AND SCREEN
ABO/RH(D): O POS
Antibody Screen: NEGATIVE

## 2020-07-11 LAB — POCT PREGNANCY, URINE: Preg Test, Ur: NEGATIVE

## 2020-07-11 MED ORDER — NORETHINDRONE ACETATE 5 MG PO TABS: 5.0000 mg | ORAL_TABLET | Freq: Every day | ORAL | 0 refills | Status: DC

## 2020-07-11 NOTE — Progress Notes (Signed)
Sent message to  Dr. Ilda Basset- patient's Hemoglobin 7.6, Dr. Eligha Bridegroom aware will proceed with surgery as scheduled.

## 2020-07-12 ENCOUNTER — Other Ambulatory Visit: Payer: Self-pay

## 2020-07-12 ENCOUNTER — Encounter (HOSPITAL_BASED_OUTPATIENT_CLINIC_OR_DEPARTMENT_OTHER)
Admission: RE | Disposition: A | Discharge status: Home / Self Care | Payer: Self-pay | Source: Home / Self Care | Attending: Obstetrics and Gynecology

## 2020-07-12 ENCOUNTER — Ambulatory Visit (HOSPITAL_BASED_OUTPATIENT_CLINIC_OR_DEPARTMENT_OTHER)
Admission: RE | Admit: 2020-07-12 | Discharge: 2020-07-12 | Disposition: A | Discharge status: Home / Self Care | Payer: Medicaid Other | Attending: Obstetrics and Gynecology | Admitting: Obstetrics and Gynecology

## 2020-07-12 ENCOUNTER — Encounter (HOSPITAL_BASED_OUTPATIENT_CLINIC_OR_DEPARTMENT_OTHER): Payer: Self-pay | Admitting: Obstetrics and Gynecology

## 2020-07-12 ENCOUNTER — Ambulatory Visit (HOSPITAL_BASED_OUTPATIENT_CLINIC_OR_DEPARTMENT_OTHER): Payer: Medicaid Other | Admitting: Anesthesiology

## 2020-07-12 DIAGNOSIS — N840 Polyp of corpus uteri: Secondary | ICD-10-CM | POA: Diagnosis not present

## 2020-07-12 DIAGNOSIS — N939 Abnormal uterine and vaginal bleeding, unspecified: Secondary | ICD-10-CM | POA: Insufficient documentation

## 2020-07-12 DIAGNOSIS — D75839 Thrombocytosis, unspecified: Secondary | ICD-10-CM | POA: Diagnosis not present

## 2020-07-12 DIAGNOSIS — D500 Iron deficiency anemia secondary to blood loss (chronic): Secondary | ICD-10-CM | POA: Insufficient documentation

## 2020-07-12 DIAGNOSIS — Z8249 Family history of ischemic heart disease and other diseases of the circulatory system: Secondary | ICD-10-CM | POA: Diagnosis not present

## 2020-07-12 DIAGNOSIS — Z833 Family history of diabetes mellitus: Secondary | ICD-10-CM | POA: Insufficient documentation

## 2020-07-12 DIAGNOSIS — Z793 Long term (current) use of hormonal contraceptives: Secondary | ICD-10-CM | POA: Insufficient documentation

## 2020-07-12 DIAGNOSIS — Z79899 Other long term (current) drug therapy: Secondary | ICD-10-CM | POA: Diagnosis not present

## 2020-07-12 DIAGNOSIS — D251 Intramural leiomyoma of uterus: Secondary | ICD-10-CM | POA: Diagnosis not present

## 2020-07-12 DIAGNOSIS — D649 Anemia, unspecified: Secondary | ICD-10-CM | POA: Diagnosis not present

## 2020-07-12 DIAGNOSIS — D630 Anemia in neoplastic disease: Secondary | ICD-10-CM | POA: Diagnosis not present

## 2020-07-12 DIAGNOSIS — N938 Other specified abnormal uterine and vaginal bleeding: Secondary | ICD-10-CM | POA: Diagnosis not present

## 2020-07-12 DIAGNOSIS — Z9889 Other specified postprocedural states: Secondary | ICD-10-CM

## 2020-07-12 DIAGNOSIS — D250 Submucous leiomyoma of uterus: Secondary | ICD-10-CM | POA: Diagnosis not present

## 2020-07-12 HISTORY — PX: DILATATION & CURETTAGE/HYSTEROSCOPY WITH MYOSURE: SHX6511

## 2020-07-12 SURGERY — DILATATION & CURETTAGE/HYSTEROSCOPY WITH MYOSURE
Anesthesia: General | Site: Vagina

## 2020-07-12 MED ORDER — SODIUM CHLORIDE 0.9 % IR SOLN
Status: DC | PRN
  Administered 2020-07-12: 3000 mL

## 2020-07-12 MED ORDER — EPHEDRINE 5 MG/ML INJ
INTRAVENOUS | Status: AC
  Filled 2020-07-12: qty 10

## 2020-07-12 MED ORDER — OXYCODONE HCL 5 MG/5ML PO SOLN: 5.0000 mg | Freq: Once | ORAL | Status: DC | PRN

## 2020-07-12 MED ORDER — LACTATED RINGERS IV SOLN: INTRAVENOUS | Status: DC

## 2020-07-12 MED ORDER — FENTANYL CITRATE (PF) 100 MCG/2ML IJ SOLN: 25.0000 ug | INTRAMUSCULAR | Status: DC | PRN

## 2020-07-12 MED ORDER — PROMETHAZINE HCL 25 MG/ML IJ SOLN: 6.2500 mg | INTRAMUSCULAR | Status: DC | PRN

## 2020-07-12 MED ORDER — PHENYLEPHRINE 40 MCG/ML (10ML) SYRINGE FOR IV PUSH (FOR BLOOD PRESSURE SUPPORT)
PREFILLED_SYRINGE | INTRAVENOUS | Status: DC | PRN
  Administered 2020-07-12 (×4): 80 ug via INTRAVENOUS

## 2020-07-12 MED ORDER — OXYCODONE HCL 5 MG PO TABS
5.0000 mg | ORAL_TABLET | Freq: Once | ORAL | Status: DC | PRN
Start: 2020-07-12 — End: 2020-07-12

## 2020-07-12 MED ORDER — EPHEDRINE SULFATE-NACL 50-0.9 MG/10ML-% IV SOSY
PREFILLED_SYRINGE | INTRAVENOUS | Status: DC | PRN
  Administered 2020-07-12: 10 mg via INTRAVENOUS

## 2020-07-12 MED ORDER — MIDAZOLAM HCL 5 MG/5ML IJ SOLN
INTRAMUSCULAR | Status: DC | PRN
  Administered 2020-07-12: 2 mg via INTRAVENOUS

## 2020-07-12 MED ORDER — LIDOCAINE HCL 1 % IJ SOLN
INTRAMUSCULAR | Status: DC | PRN
  Administered 2020-07-12: 20 mL

## 2020-07-12 MED ORDER — ONDANSETRON HCL 4 MG/2ML IJ SOLN
INTRAMUSCULAR | Status: AC
  Filled 2020-07-12: qty 2

## 2020-07-12 MED ORDER — SCOPOLAMINE 1 MG/3DAYS TD PT72: 1.0000 | MEDICATED_PATCH | Freq: Once | TRANSDERMAL | Status: DC

## 2020-07-12 MED ORDER — DEXAMETHASONE SODIUM PHOSPHATE 10 MG/ML IJ SOLN
INTRAMUSCULAR | Status: AC
  Filled 2020-07-12: qty 1

## 2020-07-12 MED ORDER — OXYCODONE-ACETAMINOPHEN 5-325 MG PO TABS: 1.0000 | ORAL_TABLET | Freq: Three times a day (TID) | ORAL | 0 refills | Status: DC | PRN

## 2020-07-12 MED ORDER — LIDOCAINE 2% (20 MG/ML) 5 ML SYRINGE
INTRAMUSCULAR | Status: DC | PRN
  Administered 2020-07-12: 60 mg via INTRAVENOUS

## 2020-07-12 MED ORDER — PROPOFOL 10 MG/ML IV BOLUS
INTRAVENOUS | Status: AC
  Filled 2020-07-12: qty 20

## 2020-07-12 MED ORDER — PHENYLEPHRINE 40 MCG/ML (10ML) SYRINGE FOR IV PUSH (FOR BLOOD PRESSURE SUPPORT)
PREFILLED_SYRINGE | INTRAVENOUS | Status: AC
  Filled 2020-07-12: qty 10

## 2020-07-12 MED ORDER — DEXAMETHASONE SODIUM PHOSPHATE 10 MG/ML IJ SOLN
INTRAMUSCULAR | Status: DC | PRN
  Administered 2020-07-12: 10 mg via INTRAVENOUS

## 2020-07-12 MED ORDER — POVIDONE-IODINE 10 % EX SWAB: 2.0000 "application " | Freq: Once | CUTANEOUS | Status: DC

## 2020-07-12 MED ORDER — FENTANYL CITRATE (PF) 100 MCG/2ML IJ SOLN
INTRAMUSCULAR | Status: AC
  Filled 2020-07-12: qty 2

## 2020-07-12 MED ORDER — ONDANSETRON HCL 4 MG/2ML IJ SOLN
INTRAMUSCULAR | Status: DC | PRN
  Administered 2020-07-12: 4 mg via INTRAVENOUS

## 2020-07-12 MED ORDER — MIDAZOLAM HCL 2 MG/2ML IJ SOLN
INTRAMUSCULAR | Status: AC
  Filled 2020-07-12: qty 2

## 2020-07-12 MED ORDER — PROPOFOL 10 MG/ML IV BOLUS
INTRAVENOUS | Status: DC | PRN
  Administered 2020-07-12: 140 mg via INTRAVENOUS

## 2020-07-12 MED ORDER — FENTANYL CITRATE (PF) 100 MCG/2ML IJ SOLN
INTRAMUSCULAR | Status: DC | PRN
  Administered 2020-07-12 (×2): 50 ug via INTRAVENOUS

## 2020-07-12 MED ORDER — LIDOCAINE 2% (20 MG/ML) 5 ML SYRINGE
INTRAMUSCULAR | Status: AC
  Filled 2020-07-12: qty 15

## 2020-07-12 MED ORDER — ACETAMINOPHEN 500 MG PO TABS: 1000.0000 mg | ORAL_TABLET | Freq: Once | ORAL | Status: DC

## 2020-07-12 MED ORDER — IBUPROFEN 600 MG PO TABS: 600.0000 mg | ORAL_TABLET | Freq: Four times a day (QID) | ORAL | 0 refills | Status: DC | PRN

## 2020-07-12 SURGICAL SUPPLY — 22 items
CATH ROBINSON RED A/P 16FR (CATHETERS) ×2 IMPLANT
DEVICE MYOSURE LITE (MISCELLANEOUS) IMPLANT
DEVICE MYOSURE REACH (MISCELLANEOUS) IMPLANT
DILATOR CANAL MILEX (MISCELLANEOUS) IMPLANT
GAUZE 4X4 16PLY RFD (DISPOSABLE) ×2 IMPLANT
GLOVE SURG SS PI 7.0 STRL IVOR (GLOVE) ×2 IMPLANT
GLOVE SURG UNDER POLY LF SZ7 (GLOVE) ×2 IMPLANT
GLOVE SURG UNDER POLY LF SZ7.5 (GLOVE) ×2 IMPLANT
GOWN STRL REUS W/TWL LRG LVL3 (GOWN DISPOSABLE) ×2 IMPLANT
GOWN STRL REUS W/TWL XL LVL3 (GOWN DISPOSABLE) ×2 IMPLANT
KIT PROCEDURE FLUENT (KITS) ×2 IMPLANT
MYOSURE XL FIBROID (MISCELLANEOUS)
PACK VAGINAL MINOR WOMEN LF (CUSTOM PROCEDURE TRAY) ×2 IMPLANT
PAD OB MATERNITY 4.3X12.25 (PERSONAL CARE ITEMS) ×2 IMPLANT
PAD PREP 24X48 CUFFED NSTRL (MISCELLANEOUS) ×2 IMPLANT
SEAL ROD LENS SCOPE MYOSURE (ABLATOR) ×2 IMPLANT
SLEEVE SCD COMPRESS KNEE MED (MISCELLANEOUS) ×2 IMPLANT
SUT VIC AB 2-0 SH 27 (SUTURE) ×2
SUT VIC AB 2-0 SH 27XBRD (SUTURE) IMPLANT
SYR CONTROL 10ML LL (SYRINGE) ×2 IMPLANT
SYSTEM TISS REMOVAL MYOSURE XL (MISCELLANEOUS) IMPLANT
TOWEL GREEN STERILE FF (TOWEL DISPOSABLE) ×4 IMPLANT

## 2020-07-12 NOTE — Transfer of Care (Signed)
Immediate Anesthesia Transfer of Care Note  Patient: Deborah Craig  Procedure(s) Performed: DILATATION & CURETTAGE/HYSTEROSCOPY (N/A Vagina )  Patient Location: PACU  Anesthesia Type:General  Level of Consciousness: drowsy and patient cooperative  Airway & Oxygen Therapy: Patient Spontanous Breathing and Patient connected to face mask oxygen  Post-op Assessment: Report given to RN and Post -op Vital signs reviewed and stable  Post vital signs: Reviewed and stable  Last Vitals:  Vitals Value Taken Time  BP 102/57 07/12/20 1343  Temp    Pulse 90 07/12/20 1344  Resp 20 07/12/20 1344  SpO2 100 % 07/12/20 1344    Last Pain:  Vitals:   07/12/20 1133  TempSrc: Oral  PainSc: 0-No pain         Complications: No complications documented.

## 2020-07-12 NOTE — Discharge Instructions (Signed)
° °  We will discuss your surgery once again in detail at your post-op visit in two to four weeks. If you havent already done so, please call to make your appointment as soon as possible.  Dilation and Curettage or Vacuum Curettage, Care After These instructions give you information on caring for yourself after your procedure. Your doctor may also give you more specific instructions. Call your doctor if you have any problems or questions after your procedure. HOME CARE  Do not drive for 24 hours.  Wait 1 week before doing any activities that wear you out.  Do not stand for a long time.  Limit stair climbing to once or twice a day.  Rest often.  Continue with your usual diet.  Drink enough fluids to keep your pee (urine) clear or pale yellow.  If you have a hard time pooping (constipation), you may:  Take a medicine to help you go poop (laxative) as told by your doctor.  Eat more fruit and bran.  Drink more fluids.  Take showers, not baths, for as long as told by your doctor.  Do not swim or use a hot tub until your doctor says it is okay.  Have someone with you for 1day after the procedure.  Do not douche, use tampons, or have sex (intercourse) until seen by your doctor  Only take medicines as told by your doctor. Do not take aspirin. It can cause bleeding.  Keep all doctor visits. GET HELP IF:  You have cramps or pain not helped by medicine.  You have new pain in the belly (abdomen).  You have a bad smelling fluid coming from your vagina.  You have a rash.  You have problems with any medicine. GET HELP RIGHT AWAY IF:   You start to bleed more than a regular period.  You have a fever.  You have chest pain.  You have trouble breathing.  You feel dizzy or feel like passing out (fainting).  You pass out.  You have pain in the tops of your shoulders.  You have vaginal bleeding with or without clumps of blood (blood clots). MAKE SURE YOU:  Understand  these instructions.  Will watch your condition.  Will get help right away if you are not doing well or get worse. Document Released: 03/05/2008 Document Revised: 06/01/2013 Document Reviewed: 12/24/2012 [facility] Patient Information 2015 [place], [place]. This information is not intended to replace advice given to you by your health care provider. Make sure you discuss any questions you have with your health care provider.   Postoperative Anesthesia Instructions-Pediatric  Activity: Your child should rest for the remainder of the day. A responsible individual must stay with your child for 24 hours.  Meals: Your child should start with liquids and light foods such as gelatin or soup unless otherwise instructed by the physician. Progress to regular foods as tolerated. Avoid spicy, greasy, and heavy foods. If nausea and/or vomiting occur, drink only clear liquids such as apple juice or Pedialyte until the nausea and/or vomiting subsides. Call your physician if vomiting continues.  Special Instructions/Symptoms: Your child may be drowsy for the rest of the day, although some children experience some hyperactivity a few hours after the surgery. Your child may also experience some irritability or crying episodes due to the operative procedure and/or anesthesia. Your child's throat may feel dry or sore from the anesthesia or the breathing tube placed in the throat during surgery. Use throat lozenges, sprays, or ice chips if needed.

## 2020-07-12 NOTE — Anesthesia Postprocedure Evaluation (Signed)
Anesthesia Post Note  Patient: Davan Fawley  Procedure(s) Performed: DILATATION & CURETTAGE/HYSTEROSCOPY (N/A Vagina )     Patient location during evaluation: PACU Anesthesia Type: General Level of consciousness: awake and alert and oriented Pain management: pain level controlled Vital Signs Assessment: post-procedure vital signs reviewed and stable Respiratory status: spontaneous breathing, nonlabored ventilation and respiratory function stable Cardiovascular status: blood pressure returned to baseline Postop Assessment: no apparent nausea or vomiting Anesthetic complications: no   No complications documented.  Last Vitals:  Vitals:   07/12/20 1345 07/12/20 1400  BP: 105/60 116/69  Pulse: 93 83  Resp: (!) 21 19  Temp:    SpO2: 100% 100%    Last Pain:  Vitals:   07/12/20 1400  TempSrc:   PainSc: 0-No pain                 Brennan Bailey

## 2020-07-12 NOTE — Anesthesia Procedure Notes (Signed)
Procedure Name: LMA Insertion Date/Time: 07/12/2020 12:50 PM Performed by: Genelle Bal, CRNA Pre-anesthesia Checklist: Patient identified, Emergency Drugs available, Suction available and Patient being monitored Patient Re-evaluated:Patient Re-evaluated prior to induction Oxygen Delivery Method: Circle system utilized Preoxygenation: Pre-oxygenation with 100% oxygen Induction Type: IV induction Ventilation: Mask ventilation without difficulty LMA: LMA inserted LMA Size: 4.0 Number of attempts: 1 Airway Equipment and Method: Bite block Placement Confirmation: positive ETCO2 Tube secured with: Tape Dental Injury: Teeth and Oropharynx as per pre-operative assessment

## 2020-07-12 NOTE — Op Note (Signed)
Operative Note   07/12/2020  PRE-OP DIAGNOSIS: 7cm submucosal/intracavitary fibroid on MRI. Abnormal uterine bleeding. Anemia. History of blood transfusion   POST-OP DIAGNOSIS: ?intramural fibroid   SURGEON: Surgeon(s) and Role:    * Aletha Halim, MD - Primary  ASSISTANT: None  PROCEDURE: Hysteroscopy, dilation and curettage. Stitch to left anterior lip of the cervix  ANESTHESIA: General and paracervical block  ESTIMATED BLOOD LOSS: 68mL  DRAINS: No I/O cath done   TOTAL IV FLUIDS: per anesthesia note  SPECIMENS: endometrial curettings  VTE PROPHYLAXIS: SCDs to the bilateral lower extremities  ANTIBIOTICS: Not indicated  FLUID DEFICIT: 989QJ  COMPLICATIONS: None  DISPOSITION: PACU - hemodynamically stable.  CONDITION: stable   FINDINGS: Exam under anesthesia revealed small, mobile mid plane uterus with no masses and bilateral adnexa without masses or fullness. Hysteroscopy revealed a grossly normal appearing uterine cavity with bilateral tubal ostia and normal appearing endocervical canal. ?small polyp seen on anterior wall towards fundus. On the left posterior wall about halfway between in the internal os and the fundus, there was slight distortion of the uterine cavity which looked like this was due to an intramural fibroid or could just be part of her normal anatomy. This area was contiguous with the rest of the uterine cavity, looked just like the rest of the cavity and didn't appear to be distinct from the rest of the cavity.   PROCEDURE IN DETAIL:  After informed consent was obtained, the patient was taken to the operating room where anesthesia was obtained without difficulty. The patient was positioned in the dorsal lithotomy position in [place].  The patient's bladder was catheterized with an in and out foley catheter.  The patient was examined under anesthesia, with the above noted findings.  The bi-valved speculum was placed inside the patient's vagina, and  the the anterior lip of the cervix was seen and grasped with the tenaculum.  A paracervical block was achieved with 69mL 1% lidocaine.  The cervix was progressively dilated to a 21 French-Pratt dilator.  The hysteroscope was introduced, with the above noted findings; a second single toothed tenaculum was placed on the posterior lip to help with keeping water in the cavity.  Small graspers were used to remove the polyp.  The hystersocope was removed and the uterine cavity was curetted until a gritty texture was noted, yielding scant endometrial curettings.  Excellent hemostasis was noted, and all instruments were removed, with excellent hemostasis noted throughout except for the right anterior lip former tenaculum site. A figure of eight 3-0 vicryl stitch was placed there. She was then taken out of dorsal lithotomy. The patient tolerated the procedure well.  Sponge, lap and instrument counts were correct x2.  The patient was taken to recovery room in excellent condition.  Durene Romans MD Attending Center for [employer] farm manager)

## 2020-07-12 NOTE — Anesthesia Preprocedure Evaluation (Signed)
Anesthesia Evaluation  Patient identified by MRN, date of birth, ID band Patient awake    Reviewed: Allergy & Precautions, NPO status , Patient's Chart, lab work & pertinent test results  History of Anesthesia Complications Negative for: history of anesthetic complications  Airway Mallampati: II  TM Distance: >3 FB Neck ROM: Full    Dental no notable dental hx.    Pulmonary neg pulmonary ROS,    Pulmonary exam normal        Cardiovascular negative cardio ROS Normal cardiovascular exam     Neuro/Psych negative neurological ROS  negative psych ROS   GI/Hepatic negative GI ROS, Neg liver ROS,   Endo/Other  negative endocrine ROS  Renal/GU negative Renal ROS  negative genitourinary   Musculoskeletal negative musculoskeletal ROS (+)   Abdominal   Peds  Hematology  (+) anemia , Hgb 7.6   Anesthesia Other Findings Day of surgery medications reviewed with patient.  Reproductive/Obstetrics AUB                             Anesthesia Physical Anesthesia Plan  ASA: II  Anesthesia Plan: General   Post-op Pain Management:    Induction: Intravenous  PONV Risk Score and Plan: 3 and Treatment may vary due to age or medical condition, Midazolam, Ondansetron, Dexamethasone and Scopolamine patch - Pre-op  Airway Management Planned: LMA  Additional Equipment: None  Intra-op Plan:   Post-operative Plan: Extubation in OR  Informed Consent: I have reviewed the patients History and Physical, chart, labs and discussed the procedure including the risks, benefits and alternatives for the proposed anesthesia with the patient or authorized representative who has indicated his/her understanding and acceptance.     Dental advisory given  Plan Discussed with: CRNA  Anesthesia Plan Comments:         Anesthesia Quick Evaluation

## 2020-07-12 NOTE — H&P (Signed)
Obstetrics & Gynecology Surgical H&P   Date of Surgery: 07/12/2020    Primary OBGYN: [facility]  Reason for Admission: scheduled surgery  History of Present Illness: [name] is a 30 y.o. G1P0101 (Patient's last menstrual period was 06/24/2020 (exact date).), with the above CC. PMHx is significant for large submucosal fibroids, anemia, h/o transfusion.  No s/s of anemia and no menopausal s/s. She confirms taking qday iron.   She had 2-3d of bleeding (not heavy) on jan 15 and irregular light bleeding prior. She received depot lupron #2 4m shot a few days ago    ROS: A 12-point review of systems was performed and negative, except as stated in the above HPI.  OBGYN History: As per HPI. OB History  Gravida Para Term Preterm AB Living  1 1   1   1   SAB IAB Ectopic Multiple Live Births          1    # Outcome Date GA Lbr Len/2nd Weight Sex Delivery Anes PTL Lv  1 Preterm 09/14/11 [redacted]w[redacted]d 18:44 / 00:18 2954 g F Vag-Spont None  LIV     Birth Comments: wnl      Past Medical History: Past Medical History:  Diagnosis Date  . Abnormal uterine bleeding (AUB) 07/11/2019  . Anemia 07/11/2019   SYMPTOMATIC  . Iron deficiency anemia 07/11/2019  . Uterine leiomyoma 08/05/2019    Past Surgical History: Past Surgical History:  Procedure Laterality Date  . WISDOM TOOTH EXTRACTION      Family History:  Family History  Problem Relation Age of Onset  . Diabetes Mother   . Hypertension Mother     Social History:  Social History   Socioeconomic History  . Marital status: Single    Spouse name: Not on file  . Number of children: Not on file  . Years of education: Not on file  . Highest education level: Not on file  Occupational History  . Not on file  Tobacco Use  . Smoking status: Never Smoker  . Smokeless tobacco: Never Used  Vaping Use  . Vaping Use: Never used  Substance and Sexual Activity  . Alcohol use: No  . Drug use: No  . Sexual  activity: Yes    Birth control/protection: None  Other Topics Concern  . Not on file  Social History Narrative  . Not on file   Social Determinants of Health   Financial Resource Strain: Not on file  Food Insecurity: No Food Insecurity  . Worried About Charity fundraiser in the Last Year: Never true  . Ran Out of Food in the Last Year: Never true  Transportation Needs: No Transportation Needs  . Lack of Transportation (Medical): No  . Lack of Transportation (Non-Medical): No  Physical Activity: Not on file  Stress: Not on file  Social Connections: Not on file  Intimate Partner Violence: Not on file     Allergy: No Known Allergies  Current Outpatient Medications: Medications Prior to Admission  Medication Sig Dispense Refill Last Dose  . docusate sodium (COLACE) 100 MG capsule Take 1 capsule (100 mg total) by mouth 2 (two) times daily as needed. (Patient taking differently: Take 100 mg by mouth 2 (two) times daily as needed for mild constipation.) 30 capsule 2 07/11/2020 at Unknown time  . Iron Polysacch Cmplx-B12-FA 150-0.025-1 MG CAPS Take 1 tablet by mouth daily. 60 capsule 0 07/11/2020 at Unknown time  . norethindrone (AYGESTIN) 5 MG tablet Take 1  tablet (5 mg total) by mouth daily. 90 tablet 0 07/11/2020 at Unknown time     Hospital Medications: Current Facility-Administered Medications  Medication Dose Route Frequency Provider Last Rate Last Admin  . acetaminophen (TYLENOL) tablet 1,000 mg  1,000 mg Oral Once Brennan Bailey, MD      . lactated ringers infusion   Intravenous Continuous Lynda Rainwater, MD 10 mL/hr at 07/12/20 1144 New Bag at 07/12/20 1144  . lactated ringers infusion   Intravenous Continuous Aletha Halim, MD      . povidone-iodine 10 % swab 2 application  2 application Topical Once Aletha Halim, MD      . scopolamine (TRANSDERM-SCOP) 1 MG/3DAYS 1.5 mg  1 patch Transdermal Once Brennan Bailey, MD         Physical Exam:  Current Vital Signs  24h Vital Sign Ranges  T 98.6 F (37 C) Temp  Avg: 98.6 F (37 C)  Min: 98.6 F (37 C)  Max: 98.6 F (37 C)  BP 105/64 BP  Min: 105/64  Max: 105/64  HR 80 Pulse  Avg: 80  Min: 80  Max: 80  RR 16 Resp  Avg: 16  Min: 16  Max: 16  SaO2 100 % Room Air SpO2  Avg: 100 %  Min: 100 %  Max: 100 %       24 Hour I/O Current Shift I/O  Time Ins Outs No intake/output data recorded. No intake/output data recorded.    Body mass index is 27.91 kg/m. General appearance: Well nourished, well developed female in no acute distress.  Cardiovascular: S1, S2 normal, no murmur, rub or gallop, regular rate and rhythm Respiratory:  Clear to auscultation bilateral. Normal respiratory effort Abdomen: positive bowel sounds and no masses, hernias; diffusely non tender to palpation, non distended Neuro/Psych:  Normal mood and affect.  Skin:  Warm and dry.  Extremities: no clubbing, cyanosis, or edema.   Pelvic exam:  deferred   Laboratory: UPT: neg COVID: neg Recent Labs  Lab 07/11/20 1200  WBC 5.7  HGB 7.6*  HCT 28.7*  PLT 653*   No results for input(s): NA, K, CL, CO2, BUN, CREATININE, CALCIUM, PROT, BILITOT, ALKPHOS, ALT, AST, GLUCOSE in the last 168 hours.  Invalid input(s): LABALBU No results for input(s): APTT, INR, PTT in the last 168 hours.  Invalid input(s): DRHAPTT Recent Labs  Lab 07/11/20 1200  ABORH O POS    Imaging:  Narrative & Impression  CLINICAL DATA:  Uterine fibroids.  Preoperative planning.  EXAM: MRI PELVIS WITHOUT AND WITH CONTRAST  TECHNIQUE: Multiplanar multisequence MR imaging of the pelvis was performed both before and after administration of intravenous contrast.  CONTRAST:  6.23mL GADAVIST GADOBUTROL 1 MMOL/ML IV SOLN  COMPARISON:  None.  FINDINGS: Lower Urinary Tract: No bladder or urethral abnormality identified.  Bowel:  Unremarkable visualized pelvic bowel loops.  Vascular/Lymphatic: No pathologically enlarged lymph nodes or  other significant abnormality.  Reproductive:  -- Uterus: Measures 12.9 x 9.4 x 10.3 cm (volume = 650 cm^3). A large fibroid is seen arising in the left posterior wall of the upper uterine corpus which has both submucosal and intracavitary components. This fibroid measures 7.0 x 6.7 by 7.0 cm. A 1.0 cm intramural fibroid is also seen in the left anterior lower uterine segment. Cervix is normal in appearance.  -- Intracavitary fibroids:  As described above  -- Pedunculated fibroids: None.  -- Fibroid contrast enhancement: The 7 cm submucosal an intracavitary fibroid described above shows diffuse  red degeneration, with lack of significant internal contrast enhancement.  -- Right ovary:  Appears normal.  No mass identified.  -- Left ovary:  Appears normal.  No mass identified.  Other: No abnormal free fluid.  Musculoskeletal:  Unremarkable.  IMPRESSION: 7 cm fibroid in the left posterior upper uterine corpus, which has both submucosal and intracavitary components. This fibroid shows diffuse red degeneration, without significant internal contrast enhancement.  1.0 cm intramural fibroid in the left anterior lower uterine segment.  Normal appearance of both ovaries. No adnexal mass identified.   Electronically Signed   By: Marlaine Hind M.D.   On: 04/17/2020 09:25      Assessment: [name] is a 30 y.o. G1P0101 (Patient's last menstrual period was 06/24/2020 (exact date).) here for scheduled surgery. Pt stable  Plan: I told her that depending on how the surgery goes that she may or may not need a transfusion, but, at the very least will need a dose of IV iron; will see if they can do that in the pacu or will set up as an outpatient for the iv iron  Post op instructions given   Pt confirms she got the qday aygestin add back medication. I told her to take it until all are done (#90)   Durene Romans MD Attending Center for [facility]  (Faculty Practice) [phone]

## 2020-07-13 ENCOUNTER — Telehealth: Payer: Self-pay | Admitting: Obstetrics and Gynecology

## 2020-07-13 ENCOUNTER — Other Ambulatory Visit: Payer: Self-pay | Admitting: Obstetrics and Gynecology

## 2020-07-13 ENCOUNTER — Encounter (HOSPITAL_BASED_OUTPATIENT_CLINIC_OR_DEPARTMENT_OTHER): Payer: Self-pay | Admitting: Obstetrics and Gynecology

## 2020-07-13 DIAGNOSIS — D259 Leiomyoma of uterus, unspecified: Secondary | ICD-10-CM

## 2020-07-13 LAB — SURGICAL PATHOLOGY

## 2020-07-13 NOTE — Telephone Encounter (Signed)
GYN Telephone Note  Patient healing well after surgery and no problems or issues.  D/w her re: findings. I told her that I recommend an u/s to correlate lack of intracavitary fibroid seen on hysteroscopy yesterday. I told her that if the fibroid is gone then the lupron did shrink it/eliminate it. If it's still seen on ultrasound, then it must be an intramural fibroid, which, if addressed, would have to be laparoscopically or open, but I did tell her that the cavity looked fine to do an IUD, as an option.  Will also have patient set up for IV iron and she to continue on the aygestin add back  Durene Romans MD Attending [facility] [employer] [facility] (Faculty Practice) 07/13/2020 Time: 1142am

## 2020-07-13 NOTE — Telephone Encounter (Signed)
GYN Telephone Note  Patient called at [phone]. VM left to call me back in the office to go over findings from yesterday.  Durene Romans MD Attending [facility] [employer] [facility] (Faculty Practice) 07/13/2020 Time: [phone]

## 2020-07-14 ENCOUNTER — Telehealth: Payer: Self-pay

## 2020-07-14 NOTE — Telephone Encounter (Addendum)
-----   Message from Aletha Halim, MD sent at 07/13/2020 11:52 AM EST ----- Regarding: needs transvag u/s anytime and needs iv iron set up I wrote the iv iron to be two doses 14 days apart. Thanks!  U/S scheduled for February 16th @ 1500.   IV Iron infusion scheduled for February 10th @ 1000.   Pt notified of both appts and that she will be able to access appt information from her MyChart.  Pt verbalized understanding with no further questions.    Mel Almond, RN  07/14/20

## 2020-07-19 NOTE — Discharge Instructions (Signed)

## 2020-07-20 ENCOUNTER — Ambulatory Visit (HOSPITAL_COMMUNITY)
Admission: RE | Admit: 2020-07-20 | Discharge: 2020-07-20 | Disposition: A | Discharge status: Home / Self Care | Payer: Medicaid Other | Source: Ambulatory Visit | Attending: Obstetrics and Gynecology | Admitting: Obstetrics and Gynecology

## 2020-07-20 ENCOUNTER — Other Ambulatory Visit: Payer: Self-pay

## 2020-07-20 DIAGNOSIS — D509 Iron deficiency anemia, unspecified: Secondary | ICD-10-CM | POA: Insufficient documentation

## 2020-07-20 MED ORDER — SODIUM CHLORIDE 0.9 % IV SOLN
500.0000 mg | INTRAVENOUS | Status: DC
  Administered 2020-07-20: 500 mg via INTRAVENOUS
  Filled 2020-07-20: qty 25

## 2020-07-26 ENCOUNTER — Other Ambulatory Visit: Payer: Self-pay

## 2020-07-26 ENCOUNTER — Ambulatory Visit
Admission: RE | Admit: 2020-07-26 | Discharge: 2020-07-26 | Disposition: A | Discharge status: Home / Self Care | Payer: Medicaid Other | Source: Ambulatory Visit | Attending: Obstetrics and Gynecology | Admitting: Obstetrics and Gynecology

## 2020-07-26 DIAGNOSIS — D259 Leiomyoma of uterus, unspecified: Secondary | ICD-10-CM | POA: Insufficient documentation

## 2020-07-26 DIAGNOSIS — D250 Submucous leiomyoma of uterus: Secondary | ICD-10-CM | POA: Diagnosis not present

## 2020-07-31 ENCOUNTER — Encounter: Payer: Self-pay | Admitting: Obstetrics and Gynecology

## 2020-07-31 ENCOUNTER — Telehealth (INDEPENDENT_AMBULATORY_CARE_PROVIDER_SITE_OTHER): Payer: Medicaid Other | Admitting: Obstetrics and Gynecology

## 2020-07-31 ENCOUNTER — Other Ambulatory Visit: Payer: Self-pay

## 2020-07-31 VITALS — Ht 62.0 in | Wt 147.0 lb

## 2020-07-31 DIAGNOSIS — D649 Anemia, unspecified: Secondary | ICD-10-CM | POA: Diagnosis not present

## 2020-07-31 DIAGNOSIS — D259 Leiomyoma of uterus, unspecified: Secondary | ICD-10-CM | POA: Diagnosis not present

## 2020-07-31 NOTE — Progress Notes (Addendum)
GYNECOLOGY VIRTUAL VISIT ENCOUNTER NOTE  Provider location: [facility]   I connected with Deborah Craig on 07/31/20 at 10:15 AM EST  by MyChart Video Encounter at home and verified that I am speaking with the correct person using two identifiers.   I discussed the limitations, risks, security and privacy concerns of performing an evaluation and management service virtually and the availability of in person appointments. I also discussed with the patient that there may be a patient responsible charge related to this service. The patient expressed understanding and agreed to proceed.  Chief Complaint: follow up u/s   History:  Deborah Craig is a 30 y.o. G1P0101 being evaluated today for above CC. Patient underwent 07/12/2020 hysteroscopy with plan for myomectomy for a 7cm submucosal fibroid; she had been on depot lupron for approximately 4 months prior to this due to her anemia.  At the time of surgery. Her uterine cavity was normal with no evidence of a submucosal fibroid.  Repeat u/s obtained post op and she was set up for an iron infusion to help her anemia more.  Repeat u/s showed a smaller 5cm posterior upper submucosal fibroid. She had her first venofer injection on 2/10.  She is doing well w/o any issues or bleeding currently      Past Medical History:  Diagnosis Date  . Abnormal uterine bleeding (AUB) 07/11/2019  . Anemia 07/11/2019   SYMPTOMATIC  . Iron deficiency anemia 07/11/2019  . Uterine leiomyoma 08/05/2019   Past Surgical History:  Procedure Laterality Date  . DILATATION & CURETTAGE/HYSTEROSCOPY WITH MYOSURE N/A 07/12/2020   Procedure: DILATATION & CURETTAGE/HYSTEROSCOPY;  Surgeon: Aletha Halim, MD;  Location: [place] [facility];  Service: Gynecology;  Laterality: N/A;  . WISDOM TOOTH EXTRACTION     The following portions of the patient's history were reviewed and updated as appropriate: allergies, current medications,  past family history, past medical history, past social history, past surgical history and problem list.    Review of Systems:  Pertinent items noted in HPI and remainder of comprehensive ROS otherwise negative.  Physical Exam:   General:  Alert, oriented and cooperative. Patient appears to be in no acute distress.  Mental Status: Normal mood and affect. Normal behavior. Normal judgment and thought content.   Respiratory: Normal respiratory effort, no problems with respiration noted  Rest of physical exam deferred due to type of encounter  Labs and Imaging No results found for this or any previous visit (from the past 336 hour(s)). US PELVIC COMPLETE WITH TRANSVAGINAL  Result Date: 07/26/2020 CLINICAL DATA:  Uterine fibroids, follow-up, on Depo Lupron. History of D&C, hysteroscopy and MyoSure; LMP 06/24/2020 EXAM: TRANSABDOMINAL AND TRANSVAGINAL ULTRASOUND OF PELVIS TECHNIQUE: Both transabdominal and transvaginal ultrasound examinations of the pelvis were performed. Transabdominal technique was performed for global imaging of the pelvis including uterus, ovaries, adnexal regions, and pelvic cul-de-sac. It was necessary to proceed with endovaginal exam following the transabdominal exam to visualize the endometrium and uterus. COMPARISON:  01/10/2020 FINDINGS: Uterus Measurements: 10.2 x 6.1 x 8.0 cm = volume: 262 mL. Anteverted. Heterogeneous myometrium. Large submucosal leiomyoma at posterior upper uterus 4.9 x 3.9 x 4.6 cm. Endometrium Thickness: 4 mm. Displaced anteriorly by leiomyoma. Small amount of complex fluid or blood within endometrial canal. Right ovary Measurements: 3.4 x 2.0 x 2.5 cm = volume: 8.5 mL. Normal morphology without mass Left ovary Measurements: 3.8 x 2.1 x 2.3 cm = volume: 9.3 mL. Normal morphology without  mass Other findings No free pelvic fluid or adnexal masses. IMPRESSION: Large submucosal leiomyoma at posterior upper uterus 4.9 cm greatest size, displacing endometrial  complex anteriorly; previously this leiomyoma measured 7.3 x 6.7 x 6.9 cm. Small amount of complex fluid or blood within endometrial canal. Unremarkable ovaries and adnexa. Electronically Signed   By: Lavonia Dana M.D.   On: 07/26/2020 16:12       Assessment and Plan:      CBC Latest Ref Rng & Units 07/11/2020 03/19/2020 03/18/2020  WBC 4.0 - 10.5 K/uL 5.7 - 6.6  Hemoglobin 12.0 - 15.0 g/dL 7.6(L) 12.2 4.6(LL)  Hematocrit 36.0 - 46.0 % 28.7(L) 38.2 18.4(L)  Platelets 150 - 400 K/uL 653(H) - 827(H)    No results found for this or any previous visit (from the past 336 hour(s)). US PELVIC COMPLETE WITH TRANSVAGINAL  Result Date: 07/26/2020 CLINICAL DATA:  Uterine fibroids, follow-up, on Depo Lupron. History of D&C, hysteroscopy and MyoSure; LMP 06/24/2020 EXAM: TRANSABDOMINAL AND TRANSVAGINAL ULTRASOUND OF PELVIS TECHNIQUE: Both transabdominal and transvaginal ultrasound examinations of the pelvis were performed. Transabdominal technique was performed for global imaging of the pelvis including uterus, ovaries, adnexal regions, and pelvic cul-de-sac. It was necessary to proceed with endovaginal exam following the transabdominal exam to visualize the endometrium and uterus. COMPARISON:  01/10/2020 FINDINGS: Uterus Measurements: 10.2 x 6.1 x 8.0 cm = volume: 262 mL. Anteverted. Heterogeneous myometrium. Large submucosal leiomyoma at posterior upper uterus 4.9 x 3.9 x 4.6 cm. Endometrium Thickness: 4 mm. Displaced anteriorly by leiomyoma. Small amount of complex fluid or blood within endometrial canal. Right ovary Measurements: 3.4 x 2.0 x 2.5 cm = volume: 8.5 mL. Normal morphology without mass Left ovary Measurements: 3.8 x 2.1 x 2.3 cm = volume: 9.3 mL. Normal morphology without mass Other findings No free pelvic fluid or adnexal masses. IMPRESSION: Large submucosal leiomyoma at posterior upper uterus 4.9 cm greatest size, displacing endometrial complex anteriorly; previously this leiomyoma measured 7.3 x 6.7  x 6.9 cm. Small amount of complex fluid or blood within endometrial canal. Unremarkable ovaries and adnexa. Electronically Signed   By: Lavonia Dana M.D.   On: 07/26/2020 16:12      Assessment and Plan:     1. Uterine leiomyoma, unspecified location I told her that based on the u/s images and operative findings I would say that her fibroid looks more intramural and recommend having her see a [facility] surgeon for consideration for a laparoscopic myomectomy instead of an open myomectomy given one, focal fibroid.  2. Anemia Patient has repeat infusion for later this week   I discussed the assessment and treatment plan with the patient. The patient was provided an opportunity to ask questions and all were answered. The patient agreed with the plan and demonstrated an understanding of the instructions.   The patient was advised to call back or seek an in-person evaluation/go to the ED if the symptoms worsen or if the condition fails to improve as anticipated.  I provided 20 minutes of face-to-face time during this encounter.   Aletha Halim, MD [facility] [place]

## 2020-08-03 ENCOUNTER — Encounter (HOSPITAL_COMMUNITY)
Admission: RE | Admit: 2020-08-03 | Discharge: 2020-08-03 | Disposition: A | Discharge status: Home / Self Care | Payer: Medicaid Other | Source: Ambulatory Visit | Attending: Obstetrics and Gynecology | Admitting: Obstetrics and Gynecology

## 2020-08-03 ENCOUNTER — Other Ambulatory Visit: Payer: Self-pay

## 2020-08-03 ENCOUNTER — Telehealth: Payer: Self-pay | Admitting: General Practice

## 2020-08-03 DIAGNOSIS — D509 Iron deficiency anemia, unspecified: Secondary | ICD-10-CM | POA: Insufficient documentation

## 2020-08-03 MED ORDER — SODIUM CHLORIDE 0.9 % IV SOLN
500.0000 mg | INTRAVENOUS | Status: DC
  Administered 2020-08-03: 500 mg via INTRAVENOUS
  Filled 2020-08-03: qty 25

## 2020-08-03 NOTE — Telephone Encounter (Signed)
Patient notified of appt scheduled on 08/18/2020 at 10:30am and verbalized understanding.

## 2020-08-03 NOTE — Telephone Encounter (Signed)
-----   Message from Aletha Halim, MD sent at 08/03/2020  7:51 AM EST ----- Regarding: in person office consult with dr Ihor Dow. potential surgery Speaks english. thanks

## 2020-08-18 ENCOUNTER — Ambulatory Visit: Payer: Medicaid Other | Admitting: Obstetrics & Gynecology

## 2020-08-18 ENCOUNTER — Encounter: Payer: Self-pay | Admitting: Obstetrics & Gynecology

## 2020-08-18 ENCOUNTER — Other Ambulatory Visit: Payer: Self-pay

## 2020-08-18 VITALS — BP 119/74 | HR 91 | Ht 62.0 in | Wt 150.0 lb

## 2020-08-18 DIAGNOSIS — D251 Intramural leiomyoma of uterus: Secondary | ICD-10-CM | POA: Diagnosis not present

## 2020-08-18 NOTE — Patient Instructions (Signed)
Uterine Fibroids  Uterine fibroids, also called leiomyomas, are noncancerous (benign) tumors that can grow in the uterus. They can cause heavy menstrual bleeding and pain. Fibroids may also grow in the fallopian tubes, cervix, or tissues (ligaments) near the uterus. You may have one or many fibroids. Fibroids vary in size, weight, and where they grow in the uterus. Some can become quite large. Most fibroids do not require medical treatment. What are the causes? The cause of this condition is not known. What increases the risk? You are more likely to develop this condition if you:  Are in your 30s or 40s and have not gone through menopause.  Have a family history of this condition.  Are of African American descent.  Started your menstrual period at age 102 or younger.  Have never given birth.  Are overweight or obese. What are the signs or symptoms? Many women do not have any symptoms. Symptoms of this condition may include:  Heavy menstrual bleeding.  Bleeding between menstrual periods.  Pain and pressure in the pelvic area, between your hip bones.  Pain during sex.  Bladder problems, such as needing to urinate right away or more often than usual.  Inability to have children (infertility).  Failure to carry pregnancy to term (miscarriage). How is this diagnosed? This condition may be diagnosed based on:  Your symptoms and medical history.  A physical exam.  A pelvic exam that includes feeling for any tumors.  Imaging tests, such as ultrasound or MRI. How is this treated? Treatment for this condition may include follow-up visits with your health care provider to monitor your fibroids for any changes. Other treatment may include:  Medicines, such as: ? Medicines to relieve pain, including aspirin and NSAIDs, such as ibuprofen or naproxen. ? Hormone therapy. Treatment may be given as a pill or an injection, or it may be inserted into the uterus using an intrauterine  device (IUD).  Surgery that would do one of the following: ? Remove the fibroids (myomectomy). This may be recommended if fibroids affect your fertility and you want to become pregnant. ? Remove the uterus (hysterectomy). ? Block the blood supply to the fibroids (uterine artery embolization). This can cause them to shrink and die. Follow these instructions at home: Medicines  Take over-the-counter and prescription medicines only as told by your health care provider.  Ask your health care provider if you should take iron pills or eat more iron-rich foods, such as dark green, leafy vegetables. Heavy menstrual bleeding can cause low iron levels. Managing pain If directed, apply heat to your back or abdomen to reduce pain. Use the heat source that your health care provider recommends, such as a moist heat pack or a heating pad. To apply heat:  Place a towel between your skin and the heat source.  Leave the heat on for 20-30 minutes.  Remove the heat if your skin turns bright red. This is especially important if you are unable to feel pain, heat, or cold. You may have a greater risk of getting burned.   General instructions  Pay close attention to your menstrual cycle. Tell your health care provider about any changes, such as: ? Heavier bleeding that requires you to change your pads or tampons more than usual. ? A change in the number of days that your menstrual period lasts. ? A change in symptoms that come with your menstrual period, such as back pain or cramps in your abdomen.  Keep all follow-up visits. This is  important, especially if your fibroids need to be monitored for any changes. Contact a health care provider if you:  Have pelvic pain, back pain, or cramps in your abdomen that do not get better with medicine or heat.  Develop new bleeding between menstrual periods.  Have increased bleeding during or between menstrual periods.  Feel more tired or weak than usual.  Feel  light-headed. Get help right away if you:  Faint.  Have pelvic pain that suddenly gets worse.  Have severe vaginal bleeding that soaks a tampon or pad in 30 minutes or less. Summary  Uterine fibroids are noncancerous (benign) tumors that can develop in the uterus.  The exact cause of this condition is not known.  Most fibroids do not require medical treatment unless they affect your ability to have children (fertility).  Contact a health care provider if you have pelvic pain, back pain, or cramps in your abdomen that do not get better with medicines.  Get help right away if you faint, have pelvic pain that suddenly gets worse, or have severe vaginal bleeding. This information is not intended to replace advice given to you by your health care provider. Make sure you discuss any questions you have with your health care provider. Document Revised: 12/28/2019 Document Reviewed: 12/28/2019 Elsevier Patient Education  [place].

## 2020-08-18 NOTE — Progress Notes (Signed)
Patient presents for surgical consult. Patient had ultrasound on 07-26-2020 (found fibroid) and D&C on 07-12-2020. Kathrene Alu RN

## 2020-08-18 NOTE — Progress Notes (Signed)
History:  30 y.o. G1P0101 here today for eval of uterine fibroids. Pt was hospitalized in 03/2020 for anemia due to menorrhagia though tdue to the fibroids. She had a hysteroscopy but, the fibroid could not be resected hysteroscopically. She is being referred for eval for consideration of robotic myomectomy.  She recently had a hysteroscopy and is on Progestins and her bleeding is now well controlled. She denies recent heavy bleeding. She denies pelvic pain but, when she was having heavy cycles, she did have pain with her cycles.  Pt is not interested in getting pregnant immediately but, does want to preserve her fertility. She has an 30 year old by birth and her 2 step children are with her today. She would like to consider another pregnancy in 4-5 years or so.     The following portions of the patient's history were reviewed and updated as appropriate: allergies, current medications, past family history, past medical history, past social history, past surgical history and problem list.  Review of Systems:  Pertinent items are noted in HPI.    Objective:  Physical Exam Blood pressure 119/74, pulse 91, height 5\' 2"  (1.575 m), weight 150 lb (68 kg).  CONSTITUTIONAL: Well-developed, well-nourished female in no acute distress.  HENT:  Normocephalic, atraumatic EYES: Conjunctivae and EOM are normal. No scleral icterus.  NECK: Normal range of motion SKIN: Skin is warm and dry. No rash noted. Not diaphoretic.No pallor. [place]: Alert and oriented to person, place, and time. Normal coordination.  Abd: Soft, nontender and nondistended Pelvic: Normal appearing external genitalia; normal appearing vaginal mucosa and cervix.  Normal discharge.  Small uterus, mobile. There is a palpable fibroid in the post uterus. It is nontender. No other palbable masses, no uterine or adnexal tenderness  Labs and Imaging US PELVIC COMPLETE WITH TRANSVAGINAL  Result Date: 07/26/2020 CLINICAL DATA:  Uterine fibroids,  follow-up, on Depo Lupron. History of D&C, hysteroscopy and MyoSure; LMP 06/24/2020 EXAM: TRANSABDOMINAL AND TRANSVAGINAL ULTRASOUND OF PELVIS TECHNIQUE: Both transabdominal and transvaginal ultrasound examinations of the pelvis were performed. Transabdominal technique was performed for global imaging of the pelvis including uterus, ovaries, adnexal regions, and pelvic cul-de-sac. It was necessary to proceed with endovaginal exam following the transabdominal exam to visualize the endometrium and uterus. COMPARISON:  01/10/2020 FINDINGS: Uterus Measurements: 10.2 x 6.1 x 8.0 cm = volume: 262 mL. Anteverted. Heterogeneous myometrium. Large submucosal leiomyoma at posterior upper uterus 4.9 x 3.9 x 4.6 cm. Endometrium Thickness: 4 mm. Displaced anteriorly by leiomyoma. Small amount of complex fluid or blood within endometrial canal. Right ovary Measurements: 3.4 x 2.0 x 2.5 cm = volume: 8.5 mL. Normal morphology without mass Left ovary Measurements: 3.8 x 2.1 x 2.3 cm = volume: 9.3 mL. Normal morphology without mass Other findings No free pelvic fluid or adnexal masses. IMPRESSION: Large submucosal leiomyoma at posterior upper uterus 4.9 cm greatest size, displacing endometrial complex anteriorly; previously this leiomyoma measured 7.3 x 6.7 x 6.9 cm. Small amount of complex fluid or blood within endometrial canal. Unremarkable ovaries and adnexa. Electronically Signed   By: Lavonia Dana M.D.   On: 07/26/2020 16:12    Assessment & Plan:  Uterine fibroid and prev h/o AUB. Discussed with pt operative vs conservative management of fibroids. Given that pt is not having sx at present, discussed with pt continuing current management and observation. I have discussed with her the risks of surgery and the possible need for recurrent surgery if/when the fibroids return.   F/u in 3 months or sooner  prn Keep Agestin for now.   Total face-to-face time with patient was 20 min.  Greater than 50% was spent in counseling and  coordination of care with the patient.   Monserath Neff L. Harraway-Smith, M.D., Cherlynn June

## 2020-08-24 ENCOUNTER — Telehealth: Payer: Self-pay | Admitting: Obstetrics & Gynecology

## 2020-08-24 NOTE — Telephone Encounter (Signed)
TC to pt. I reviewed the Sonata procedure with her and she is interested in this for managing her fibroid. I believe that it will be a safer option for her. I will refer her to Dr. Elgie Congo who has aleady reviewed her chart for a consult and to be scheduled.   Roma Bondar L. Harraway-Smith, M.D., Cherlynn June

## 2020-08-28 ENCOUNTER — Other Ambulatory Visit: Payer: Self-pay

## 2020-08-30 ENCOUNTER — Other Ambulatory Visit: Payer: Self-pay | Admitting: Pharmacist

## 2020-08-30 MED ORDER — IRON POLYSACCH CMPLX-B12-FA 150-0.025-1 MG PO CAPS: 1.0000 | ORAL_CAPSULE | Freq: Every day | ORAL | 0 refills | Status: DC

## 2020-08-31 MED ORDER — NORETHINDRONE ACETATE 5 MG PO TABS: 5.0000 mg | ORAL_TABLET | Freq: Every day | ORAL | 0 refills | Status: DC

## 2020-09-04 MED ORDER — IBUPROFEN 600 MG PO TABS: 600.0000 mg | ORAL_TABLET | Freq: Four times a day (QID) | ORAL | 0 refills | Status: DC | PRN

## 2020-09-21 ENCOUNTER — Encounter: Payer: Self-pay | Admitting: Obstetrics and Gynecology

## 2020-09-21 ENCOUNTER — Telehealth (INDEPENDENT_AMBULATORY_CARE_PROVIDER_SITE_OTHER): Payer: Medicaid Other | Admitting: Obstetrics and Gynecology

## 2020-09-21 DIAGNOSIS — N925 Other specified irregular menstruation: Secondary | ICD-10-CM | POA: Diagnosis not present

## 2020-09-21 DIAGNOSIS — D259 Leiomyoma of uterus, unspecified: Secondary | ICD-10-CM

## 2020-09-21 DIAGNOSIS — D251 Intramural leiomyoma of uterus: Secondary | ICD-10-CM

## 2020-09-21 DIAGNOSIS — N939 Abnormal uterine and vaginal bleeding, unspecified: Secondary | ICD-10-CM

## 2020-09-21 NOTE — Progress Notes (Signed)
    GYNECOLOGY VIRTUAL VISIT ENCOUNTER NOTE  Provider location: [facility]   Patient location: Home  I connected with Deborah Craig on 09/21/20 at  3:35 PM EDT by MyChart Video Encounter and verified that I am speaking with the correct person using two identifiers.   I discussed the limitations, risks, security and privacy concerns of performing an evaluation and management service virtually and the availability of in person appointments. I also discussed with the patient that there may be a patient responsible charge related to this service. The patient expressed understanding and agreed to proceed.   History:  Deborah Craig is a 30 y.o. G59P0101 female being evaluated today for discussion of Sonata treatment for fibroids. She notes short irregular menses with spotting.  Discussed mechanics of the procedure including follow up.  Spoke with [employer] rep regarding pregnancy afterwards.  Per rep pregnancy is not contraindicated after te procedure.  Findings relayed to patient.    Past Medical History:  Diagnosis Date  . Abnormal uterine bleeding (AUB) 07/11/2019  . Anemia 07/11/2019   SYMPTOMATIC  . Iron deficiency anemia 07/11/2019  . Uterine leiomyoma 08/05/2019   Past Surgical History:  Procedure Laterality Date  . DILATATION & CURETTAGE/HYSTEROSCOPY WITH MYOSURE N/A 07/12/2020   Procedure: DILATATION & CURETTAGE/HYSTEROSCOPY;  Surgeon: Aletha Halim, MD;  Location: [place];  Service: Gynecology;  Laterality: N/A;  . WISDOM TOOTH EXTRACTION     The following portions of the patient's history were reviewed and updated as appropriate: allergies, current medications, past family history, past medical history, past social history, past surgical history and problem list.   Health Maintenance:  Normal pap and negative HRHPV on 08/05/19.    Review of Systems:  Pertinent items noted in HPI and remainder of comprehensive ROS otherwise  negative.  Physical Exam:   General:  Alert, oriented and cooperative. Patient appears to be in no acute distress.  Mental Status: Normal mood and affect. Normal behavior. Normal judgment and thought content.   Respiratory: Normal respiratory effort, no problems with respiration noted  Rest of physical exam deferred due to type of encounter  Labs and Imaging No results found for this or any previous visit (from the past 336 hour(s)). No results found.     Assessment and Plan:     There are no diagnoses linked to this encounter.      I discussed the assessment and treatment plan with the patient. The patient was provided an opportunity to ask questions and all were answered. The patient agreed with the plan and demonstrated an understanding of the instructions.   The patient was advised to call back or seek an in-person evaluation/go to the ED if the symptoms worsen or if the condition fails to improve as anticipated.  I provided 10 minutes of face-to-face time during this encounter.   Griffin Basil, MD [facility], [place]

## 2020-09-27 ENCOUNTER — Telehealth: Payer: Self-pay | Admitting: *Deleted

## 2020-09-27 NOTE — Telephone Encounter (Signed)
Call to patient to discuss scheduling for Sonata. Left message to call back.

## 2020-09-27 NOTE — Telephone Encounter (Signed)
Return call from patient. Reviewed scheduling process. Aware surgery days are Tuesday and Wednesday and will need driver. Discussed precert and need for ROI for [employer] to work with [employer] for procedure authorization. Patient to come by office to sign consent.

## 2020-10-04 NOTE — Telephone Encounter (Signed)
Patient left voice mail returning call. Call back to patient. She plans to come tomorrow to review forms for [employer].

## 2020-10-04 NOTE — Telephone Encounter (Signed)
Call to patient. Voice mail has number confirmation.  Left message calling to follow-up on plan for scheduling and signing consent form (ROI for authorization). Left message requesting update to [phone].

## 2020-10-05 ENCOUNTER — Encounter: Payer: Self-pay | Admitting: *Deleted

## 2020-10-05 NOTE — Telephone Encounter (Signed)
Patient came to office to sign ROI for Sonata precert process.  Advised will contact her for scheduling once insurance company responds.   Encounter closed.

## 2020-10-30 NOTE — Telephone Encounter (Signed)
Email sent to [place] with [employer] for update in precert.

## 2021-03-05 ENCOUNTER — Encounter: Payer: Self-pay | Admitting: *Deleted

## 2021-03-14 ENCOUNTER — Encounter (HOSPITAL_BASED_OUTPATIENT_CLINIC_OR_DEPARTMENT_OTHER): Payer: Self-pay | Admitting: Obstetrics and Gynecology

## 2021-03-14 ENCOUNTER — Other Ambulatory Visit: Payer: Self-pay

## 2021-03-14 NOTE — Progress Notes (Signed)
Spoke w/ via phone for pre-op interview---pt Lab needs dos---- urine poct per anesthesia surgery orders pending              Lab results------none COVID test -----patient states asymptomatic no test needed Arrive at -------820 am 03-21-2021 NPO after MN NO Solid Food.  Clear liquids from MN until---720 am  Med rec completed Medications to take morning of surgery -----none Diabetic medication -----n/a Patient instructed no nail polish to be worn day of surgery Patient instructed to bring photo id and insurance card day of surgery Patient aware to have Driver (ride ) / caregiver   boyfrined Deborah Craig  for 24 hours after surgery  Patient Special Instructions -----none Pre-Op special Istructions -----surgery orders req dr bass epic ib Patient verbalized understanding of instructions that were given at this phone interview. Patient denies shortness of breath, chest pain, fever, cough at this phone interview.

## 2021-03-15 NOTE — Addendum Note (Signed)
Addended by: Griffin Basil on: 03/15/2021 08:37 AM   Modules accepted: Orders, SmartSet

## 2021-03-15 NOTE — Addendum Note (Signed)
Addended by: Griffin Basil on: 03/15/2021 08:32 AM   Modules accepted: Miquel Dunn

## 2021-03-19 ENCOUNTER — Encounter: Payer: Self-pay | Admitting: Obstetrics and Gynecology

## 2021-03-19 ENCOUNTER — Encounter: Payer: Self-pay | Admitting: Family Medicine

## 2021-03-19 ENCOUNTER — Other Ambulatory Visit: Payer: Self-pay

## 2021-03-19 ENCOUNTER — Ambulatory Visit (INDEPENDENT_AMBULATORY_CARE_PROVIDER_SITE_OTHER): Payer: Medicaid Other | Admitting: Obstetrics and Gynecology

## 2021-03-19 VITALS — BP 111/73 | HR 73 | Ht 61.0 in | Wt 159.5 lb

## 2021-03-19 DIAGNOSIS — Z01818 Encounter for other preprocedural examination: Secondary | ICD-10-CM | POA: Diagnosis not present

## 2021-03-19 DIAGNOSIS — N939 Abnormal uterine and vaginal bleeding, unspecified: Secondary | ICD-10-CM

## 2021-03-19 DIAGNOSIS — D251 Intramural leiomyoma of uterus: Secondary | ICD-10-CM

## 2021-03-19 MED ORDER — MISOPROSTOL 100 MCG PO TABS: ORAL_TABLET | ORAL | 0 refills | Status: DC

## 2021-03-19 NOTE — H&P (View-Only) (Signed)
OB/GYN Pre-Op History and Physical  Deborah Craig is a 30 y.o. G1P0101 presenting for preoperative visit prior to [facility] procedure.  Discussed risks and benefits of the procedure including bleeding, infection, involvement of other organs including bladder and bowel as well as potential sloughing of material.      Past Medical History:  Diagnosis Date   Abnormal uterine bleeding (AUB) 07/11/2019   Anemia 07/11/2019   SYMPTOMATIC   History of blood transfusion    8 months ago for anemia per pt on 03-14-2021   Iron deficiency anemia 07/11/2019   Uterine leiomyoma 08/05/2019    Past Surgical History:  Procedure Laterality Date   DILATATION & CURETTAGE/HYSTEROSCOPY WITH MYOSURE N/A 07/12/2020   Procedure: DILATATION & CURETTAGE/HYSTEROSCOPY;  Surgeon: Aletha Halim, MD;  Location: [place];  Service: Gynecology;  Laterality: N/A;   WISDOM TOOTH EXTRACTION     10 yrs ago    OB History  Gravida Para Term Preterm AB Living  1 1   1   1   SAB IAB Ectopic Multiple Live Births          1    # Outcome Date GA Lbr Len/2nd Weight Sex Delivery Anes PTL Lv  1 Preterm 09/14/11 [redacted]w[redacted]d 18:44 / 00:18 6 lb 8.2 oz (2.954 kg) F Vag-Spont None  LIV     Birth Comments: wnl    Social History   Socioeconomic History   Marital status: Single    Spouse name: Not on file   Number of children: Not on file   Years of education: Not on file   Highest education level: Not on file  Occupational History   Not on file  Tobacco Use   Smoking status: Never   Smokeless tobacco: Never  Vaping Use   Vaping Use: Never used  Substance and Sexual Activity   Alcohol use: No   Drug use: No   Sexual activity: Yes    Birth control/protection: None  Other Topics Concern   Not on file  Social History Narrative   Not on file   Social Determinants of Health   Financial Resource Strain: Not on file  Food Insecurity: No Food Insecurity   Worried About Running Out of Food in the Last Year:  Never true   Ran Out of Food in the Last Year: Never true  Transportation Needs: No Transportation Needs   Lack of Transportation (Medical): No   Lack of Transportation (Non-Medical): No  Physical Activity: Not on file  Stress: Not on file  Social Connections: Not on file    Family History  Problem Relation Age of Onset   Diabetes Mother    Hypertension Mother     (Not in a hospital admission)   No Known Allergies  Review of Systems: Negative except for what is mentioned in HPI.     Physical Exam: BP 111/73   Pulse 73   Ht 5\' 1"  (1.549 m)   Wt 159 lb 8 oz (72.3 kg)   LMP 03/02/2021   BMI 30.14 kg/m  CONSTITUTIONAL: Well-developed, well-nourished female in no acute distress.  HENT:  Normocephalic, atraumatic, External right and left ear normal. Oropharynx is clear and moist EYES: Conjunctivae and EOM are normal.  NECK: Normal range of motion, supple, no masses SKIN: Skin is warm and dry. No rash noted. Not diaphoretic. No erythema. No pallor. [place]: Alert and oriented to person, place, and time. Normal reflexes, muscle tone coordination. No cranial nerve deficit noted. PSYCHIATRIC: Normal mood and affect.  Normal behavior. Normal judgment and thought content. CARDIOVASCULAR: Normal heart rate noted, regular rhythm RESPIRATORY: Effort and breath sounds normal, no problems with respiration noted ABDOMEN: Soft, nontender, nondistended PELVIC: normal external genitalia, vagina WNL, cervix is multiparous, approx 0.5-1 cm dilated MUSCULOSKELETAL: Normal range of motion. No edema and no tenderness. 2+ distal pulses. CLINICAL DATA:  Uterine fibroids, follow-up, on Depo Lupron. History of D&C, hysteroscopy and MyoSure; LMP 06/24/2020   EXAM: TRANSABDOMINAL AND TRANSVAGINAL ULTRASOUND OF PELVIS   TECHNIQUE: Both transabdominal and transvaginal ultrasound examinations of the pelvis were performed. Transabdominal technique was performed for global imaging of the pelvis  including uterus, ovaries, adnexal regions, and pelvic cul-de-sac. It was necessary to proceed with endovaginal exam following the transabdominal exam to visualize the endometrium and uterus.   COMPARISON:  01/10/2020   FINDINGS: Uterus   Measurements: 10.2 x 6.1 x 8.0 cm = volume: 262 mL. Anteverted. Heterogeneous myometrium. Large submucosal leiomyoma at posterior upper uterus 4.9 x 3.9 x 4.6 cm.   Endometrium   Thickness: 4 mm. Displaced anteriorly by leiomyoma. Small amount of complex fluid or blood within endometrial canal.   Right ovary   Measurements: 3.4 x 2.0 x 2.5 cm = volume: 8.5 mL. Normal morphology without mass   Left ovary   Measurements: 3.8 x 2.1 x 2.3 cm = volume: 9.3 mL. Normal morphology without mass   Other findings   No free pelvic fluid or adnexal masses.   IMPRESSION: Large submucosal leiomyoma at posterior upper uterus 4.9 cm greatest size, displacing endometrial complex anteriorly; previously this leiomyoma measured 7.3 x 6.7 x 6.9 cm.   Small amount of complex fluid or blood within endometrial canal.   Unremarkable ovaries and adnexa.     Pertinent Labs/Studies:   No results found for this or any previous visit (from the past 72 hour(s)).     Assessment and Plan :Deborah Craig is a 30 y.o. G1P0101 here for preoperative exam prior to [facility] procedure.   Plan for Sonata fibroid ablation NPO Cervix prepped with misoprostol   Lynnda Shields, M.D. Attending [place], [facility] for [employer], [place]

## 2021-03-19 NOTE — Progress Notes (Signed)
OB/GYN Pre-Op History and Physical  Deborah Craig is a 30 y.o. G1P0101 presenting for preoperative visit prior to [facility] procedure.  Discussed risks and benefits of the procedure including bleeding, infection, involvement of other organs including bladder and bowel as well as potential sloughing of material.      Past Medical History:  Diagnosis Date   Abnormal uterine bleeding (AUB) 07/11/2019   Anemia 07/11/2019   SYMPTOMATIC   History of blood transfusion    8 months ago for anemia per pt on 03-14-2021   Iron deficiency anemia 07/11/2019   Uterine leiomyoma 08/05/2019    Past Surgical History:  Procedure Laterality Date   DILATATION & CURETTAGE/HYSTEROSCOPY WITH MYOSURE N/A 07/12/2020   Procedure: DILATATION & CURETTAGE/HYSTEROSCOPY;  Surgeon: Aletha Halim, MD;  Location: [place];  Service: Gynecology;  Laterality: N/A;   WISDOM TOOTH EXTRACTION     10 yrs ago    OB History  Gravida Para Term Preterm AB Living  1 1   1   1   SAB IAB Ectopic Multiple Live Births          1    # Outcome Date GA Lbr Len/2nd Weight Sex Delivery Anes PTL Lv  1 Preterm 09/14/11 [redacted]w[redacted]d 18:44 / 00:18 6 lb 8.2 oz (2.954 kg) F Vag-Spont None  LIV     Birth Comments: wnl    Social History   Socioeconomic History   Marital status: Single    Spouse name: Not on file   Number of children: Not on file   Years of education: Not on file   Highest education level: Not on file  Occupational History   Not on file  Tobacco Use   Smoking status: Never   Smokeless tobacco: Never  Vaping Use   Vaping Use: Never used  Substance and Sexual Activity   Alcohol use: No   Drug use: No   Sexual activity: Yes    Birth control/protection: None  Other Topics Concern   Not on file  Social History Narrative   Not on file   Social Determinants of Health   Financial Resource Strain: Not on file  Food Insecurity: No Food Insecurity   Worried About Running Out of Food in the Last Year:  Never true   Ran Out of Food in the Last Year: Never true  Transportation Needs: No Transportation Needs   Lack of Transportation (Medical): No   Lack of Transportation (Non-Medical): No  Physical Activity: Not on file  Stress: Not on file  Social Connections: Not on file    Family History  Problem Relation Age of Onset   Diabetes Mother    Hypertension Mother     (Not in a hospital admission)   No Known Allergies  Review of Systems: Negative except for what is mentioned in HPI.     Physical Exam: BP 111/73   Pulse 73   Ht 5\' 1"  (1.549 m)   Wt 159 lb 8 oz (72.3 kg)   LMP 03/02/2021   BMI 30.14 kg/m  CONSTITUTIONAL: Well-developed, well-nourished female in no acute distress.  HENT:  Normocephalic, atraumatic, External right and left ear normal. Oropharynx is clear and moist EYES: Conjunctivae and EOM are normal.  NECK: Normal range of motion, supple, no masses SKIN: Skin is warm and dry. No rash noted. Not diaphoretic. No erythema. No pallor. [place]: Alert and oriented to person, place, and time. Normal reflexes, muscle tone coordination. No cranial nerve deficit noted. PSYCHIATRIC: Normal mood and affect.  Normal behavior. Normal judgment and thought content. CARDIOVASCULAR: Normal heart rate noted, regular rhythm RESPIRATORY: Effort and breath sounds normal, no problems with respiration noted ABDOMEN: Soft, nontender, nondistended PELVIC: normal external genitalia, vagina WNL, cervix is multiparous, approx 0.5-1 cm dilated MUSCULOSKELETAL: Normal range of motion. No edema and no tenderness. 2+ distal pulses. CLINICAL DATA:  Uterine fibroids, follow-up, on Depo Lupron. History of D&C, hysteroscopy and MyoSure; LMP 06/24/2020   EXAM: TRANSABDOMINAL AND TRANSVAGINAL ULTRASOUND OF PELVIS   TECHNIQUE: Both transabdominal and transvaginal ultrasound examinations of the pelvis were performed. Transabdominal technique was performed for global imaging of the pelvis  including uterus, ovaries, adnexal regions, and pelvic cul-de-sac. It was necessary to proceed with endovaginal exam following the transabdominal exam to visualize the endometrium and uterus.   COMPARISON:  01/10/2020   FINDINGS: Uterus   Measurements: 10.2 x 6.1 x 8.0 cm = volume: 262 mL. Anteverted. Heterogeneous myometrium. Large submucosal leiomyoma at posterior upper uterus 4.9 x 3.9 x 4.6 cm.   Endometrium   Thickness: 4 mm. Displaced anteriorly by leiomyoma. Small amount of complex fluid or blood within endometrial canal.   Right ovary   Measurements: 3.4 x 2.0 x 2.5 cm = volume: 8.5 mL. Normal morphology without mass   Left ovary   Measurements: 3.8 x 2.1 x 2.3 cm = volume: 9.3 mL. Normal morphology without mass   Other findings   No free pelvic fluid or adnexal masses.   IMPRESSION: Large submucosal leiomyoma at posterior upper uterus 4.9 cm greatest size, displacing endometrial complex anteriorly; previously this leiomyoma measured 7.3 x 6.7 x 6.9 cm.   Small amount of complex fluid or blood within endometrial canal.   Unremarkable ovaries and adnexa.     Pertinent Labs/Studies:   No results found for this or any previous visit (from the past 72 hour(s)).     Assessment and Plan :Deborah Craig is a 30 y.o. G1P0101 here for preoperative exam prior to [facility] procedure.   Plan for Sonata fibroid ablation NPO Cervix prepped with misoprostol   Lynnda Shields, M.D. Attending [place], [facility] for [employer], [place]

## 2021-03-21 ENCOUNTER — Ambulatory Visit (HOSPITAL_BASED_OUTPATIENT_CLINIC_OR_DEPARTMENT_OTHER): Payer: Medicaid Other | Admitting: Anesthesiology

## 2021-03-21 ENCOUNTER — Encounter (HOSPITAL_BASED_OUTPATIENT_CLINIC_OR_DEPARTMENT_OTHER): Payer: Self-pay | Admitting: Obstetrics and Gynecology

## 2021-03-21 ENCOUNTER — Encounter (HOSPITAL_BASED_OUTPATIENT_CLINIC_OR_DEPARTMENT_OTHER)
Admission: RE | Disposition: A | Discharge status: Home / Self Care | Payer: Self-pay | Source: Home / Self Care | Attending: Obstetrics and Gynecology

## 2021-03-21 ENCOUNTER — Ambulatory Visit (HOSPITAL_BASED_OUTPATIENT_CLINIC_OR_DEPARTMENT_OTHER)
Admission: RE | Admit: 2021-03-21 | Discharge: 2021-03-21 | Disposition: A | Discharge status: Home / Self Care | Payer: Medicaid Other | Attending: Obstetrics and Gynecology | Admitting: Obstetrics and Gynecology

## 2021-03-21 DIAGNOSIS — D250 Submucous leiomyoma of uterus: Secondary | ICD-10-CM | POA: Insufficient documentation

## 2021-03-21 DIAGNOSIS — D251 Intramural leiomyoma of uterus: Secondary | ICD-10-CM | POA: Diagnosis not present

## 2021-03-21 DIAGNOSIS — N938 Other specified abnormal uterine and vaginal bleeding: Secondary | ICD-10-CM | POA: Diagnosis not present

## 2021-03-21 DIAGNOSIS — D259 Leiomyoma of uterus, unspecified: Secondary | ICD-10-CM | POA: Diagnosis not present

## 2021-03-21 DIAGNOSIS — N939 Abnormal uterine and vaginal bleeding, unspecified: Secondary | ICD-10-CM | POA: Diagnosis not present

## 2021-03-21 DIAGNOSIS — Z8249 Family history of ischemic heart disease and other diseases of the circulatory system: Secondary | ICD-10-CM | POA: Insufficient documentation

## 2021-03-21 DIAGNOSIS — Z833 Family history of diabetes mellitus: Secondary | ICD-10-CM | POA: Insufficient documentation

## 2021-03-21 DIAGNOSIS — N760 Acute vaginitis: Secondary | ICD-10-CM | POA: Diagnosis not present

## 2021-03-21 DIAGNOSIS — D75839 Thrombocytosis, unspecified: Secondary | ICD-10-CM | POA: Diagnosis not present

## 2021-03-21 HISTORY — DX: Personal history of other medical treatment: Z92.89

## 2021-03-21 LAB — CBC
HCT: 39.8 % (ref 36.0–46.0)
Hemoglobin: 12.7 g/dL (ref 12.0–15.0)
MCH: 27.2 pg (ref 26.0–34.0)
MCHC: 31.9 g/dL (ref 30.0–36.0)
MCV: 85.2 fL (ref 80.0–100.0)
Platelets: 359 10*3/uL (ref 150–400)
RBC: 4.67 MIL/uL (ref 3.87–5.11)
RDW: 12.9 % (ref 11.5–15.5)
WBC: 5.1 10*3/uL (ref 4.0–10.5)
nRBC: 0 % (ref 0.0–0.2)

## 2021-03-21 LAB — TYPE AND SCREEN
ABO/RH(D): O POS
Antibody Screen: NEGATIVE

## 2021-03-21 LAB — POCT PREGNANCY, URINE: Preg Test, Ur: NEGATIVE

## 2021-03-21 SURGERY — RADIOFREQUENCY ABLATION, LEIOMYOMA, UTERUS, TRANSCERVICAL APPROACH, WITH US GUIDANCE
Anesthesia: General | Site: Uterus

## 2021-03-21 MED ORDER — POVIDONE-IODINE 10 % EX SWAB: 2.0000 "application " | Freq: Once | CUTANEOUS | Status: DC

## 2021-03-21 MED ORDER — LIDOCAINE 2% (20 MG/ML) 5 ML SYRINGE
INTRAMUSCULAR | Status: AC
  Filled 2021-03-21: qty 5

## 2021-03-21 MED ORDER — LACTATED RINGERS IV SOLN: INTRAVENOUS | Status: DC

## 2021-03-21 MED ORDER — SOD CITRATE-CITRIC ACID 500-334 MG/5ML PO SOLN: 30.0000 mL | ORAL | Status: DC

## 2021-03-21 MED ORDER — PROPOFOL 10 MG/ML IV BOLUS
INTRAVENOUS | Status: DC | PRN
  Administered 2021-03-21: 200 mg via INTRAVENOUS

## 2021-03-21 MED ORDER — ACETAMINOPHEN 500 MG PO TABS
1000.0000 mg | ORAL_TABLET | Freq: Four times a day (QID) | ORAL | Status: DC | PRN
  Administered 2021-03-21: 1000 mg via ORAL

## 2021-03-21 MED ORDER — FENTANYL CITRATE (PF) 100 MCG/2ML IJ SOLN
INTRAMUSCULAR | Status: DC | PRN
  Administered 2021-03-21 (×2): 50 ug via INTRAVENOUS

## 2021-03-21 MED ORDER — AMISULPRIDE (ANTIEMETIC) 5 MG/2ML IV SOLN: 10.0000 mg | Freq: Once | INTRAVENOUS | Status: DC | PRN

## 2021-03-21 MED ORDER — KETOROLAC TROMETHAMINE 30 MG/ML IJ SOLN
INTRAMUSCULAR | Status: DC | PRN
  Administered 2021-03-21: 30 mg via INTRAVENOUS

## 2021-03-21 MED ORDER — ONDANSETRON HCL 4 MG/2ML IJ SOLN
INTRAMUSCULAR | Status: AC
  Filled 2021-03-21: qty 2

## 2021-03-21 MED ORDER — PROMETHAZINE HCL 25 MG/ML IJ SOLN: 6.2500 mg | INTRAMUSCULAR | Status: DC | PRN

## 2021-03-21 MED ORDER — KETOROLAC TROMETHAMINE 30 MG/ML IJ SOLN: 30.0000 mg | Freq: Once | INTRAMUSCULAR | Status: DC | PRN

## 2021-03-21 MED ORDER — MIDAZOLAM HCL 2 MG/2ML IJ SOLN
INTRAMUSCULAR | Status: AC
  Filled 2021-03-21: qty 2

## 2021-03-21 MED ORDER — IBUPROFEN 600 MG PO TABS: 600.0000 mg | ORAL_TABLET | Freq: Four times a day (QID) | ORAL | 1 refills | Status: DC | PRN

## 2021-03-21 MED ORDER — FENTANYL CITRATE (PF) 250 MCG/5ML IJ SOLN
INTRAMUSCULAR | Status: AC
  Filled 2021-03-21: qty 5

## 2021-03-21 MED ORDER — FENTANYL CITRATE (PF) 100 MCG/2ML IJ SOLN: 25.0000 ug | INTRAMUSCULAR | Status: DC | PRN

## 2021-03-21 MED ORDER — CEFAZOLIN SODIUM-DEXTROSE 2-4 GM/100ML-% IV SOLN
INTRAVENOUS | Status: AC
  Filled 2021-03-21: qty 100

## 2021-03-21 MED ORDER — DEXAMETHASONE SODIUM PHOSPHATE 10 MG/ML IJ SOLN
INTRAMUSCULAR | Status: AC
  Filled 2021-03-21: qty 1

## 2021-03-21 MED ORDER — PROPOFOL 10 MG/ML IV BOLUS
INTRAVENOUS | Status: AC
  Filled 2021-03-21: qty 20

## 2021-03-21 MED ORDER — ONDANSETRON HCL 4 MG/2ML IJ SOLN
INTRAMUSCULAR | Status: DC | PRN
Start: 2021-03-21 — End: 2021-03-21
  Administered 2021-03-21: 4 mg via INTRAVENOUS

## 2021-03-21 MED ORDER — ACETAMINOPHEN 500 MG PO TABS
ORAL_TABLET | ORAL | Status: AC
  Filled 2021-03-21: qty 2

## 2021-03-21 MED ORDER — MIDAZOLAM HCL 5 MG/5ML IJ SOLN
INTRAMUSCULAR | Status: DC | PRN
  Administered 2021-03-21: 2 mg via INTRAVENOUS

## 2021-03-21 MED ORDER — LIDOCAINE 2% (20 MG/ML) 5 ML SYRINGE
INTRAMUSCULAR | Status: DC | PRN
  Administered 2021-03-21: 60 mg via INTRAVENOUS

## 2021-03-21 MED ORDER — OXYCODONE HCL 5 MG/5ML PO SOLN: 5.0000 mg | Freq: Once | ORAL | Status: DC | PRN

## 2021-03-21 MED ORDER — OXYCODONE HCL 5 MG PO TABS: 5.0000 mg | ORAL_TABLET | Freq: Once | ORAL | Status: DC | PRN

## 2021-03-21 MED ORDER — LIDOCAINE HCL 1 % IJ SOLN
INTRAMUSCULAR | Status: DC | PRN
  Administered 2021-03-21: 10 mL

## 2021-03-21 MED ORDER — PHENYLEPHRINE 40 MCG/ML (10ML) SYRINGE FOR IV PUSH (FOR BLOOD PRESSURE SUPPORT)
PREFILLED_SYRINGE | INTRAVENOUS | Status: AC
  Filled 2021-03-21: qty 10

## 2021-03-21 MED ORDER — DEXAMETHASONE SODIUM PHOSPHATE 10 MG/ML IJ SOLN
INTRAMUSCULAR | Status: DC | PRN
  Administered 2021-03-21: 10 mg via INTRAVENOUS

## 2021-03-21 MED ORDER — CEFAZOLIN SODIUM-DEXTROSE 2-4 GM/100ML-% IV SOLN
2.0000 g | INTRAVENOUS | Status: AC
  Administered 2021-03-21: 2 g via INTRAVENOUS

## 2021-03-21 MED ORDER — PHENYLEPHRINE 40 MCG/ML (10ML) SYRINGE FOR IV PUSH (FOR BLOOD PRESSURE SUPPORT)
PREFILLED_SYRINGE | INTRAVENOUS | Status: DC | PRN
  Administered 2021-03-21: 80 ug via INTRAVENOUS

## 2021-03-21 SURGICAL SUPPLY — 21 items
CATH ROBINSON RED A/P 16FR (CATHETERS) ×3 IMPLANT
DEVICE MYOSURE LITE (MISCELLANEOUS) IMPLANT
DEVICE MYOSURE REACH (MISCELLANEOUS) IMPLANT
ELECT DISPERSIVE SONATA (MISCELLANEOUS) ×3 IMPLANT
GAUZE 4X4 16PLY ~~LOC~~+RFID DBL (SPONGE) ×3 IMPLANT
GLOVE SURG ENC MOIS LTX SZ8 (GLOVE) ×3 IMPLANT
GLOVE SURG UNDER POLY LF SZ7 (GLOVE) ×6 IMPLANT
GOWN STRL REUS W/ TWL LRG LVL3 (GOWN DISPOSABLE) ×4 IMPLANT
GOWN STRL REUS W/TWL LRG LVL3 (GOWN DISPOSABLE) ×6
HANDPIECE RFA SONATA (MISCELLANEOUS) ×3 IMPLANT
IV NS IRRIG 3000ML ARTHROMATIC (IV SOLUTION) IMPLANT
KIT PROCEDURE FLUENT (KITS) ×1 IMPLANT
KIT TURNOVER CYSTO (KITS) ×3 IMPLANT
MYOSURE XL FIBROID (MISCELLANEOUS)
PACK VAGINAL MINOR WOMEN LF (CUSTOM PROCEDURE TRAY) ×3 IMPLANT
PAD OB MATERNITY 4.3X12.25 (PERSONAL CARE ITEMS) ×3 IMPLANT
PAD PREP 24X48 CUFFED NSTRL (MISCELLANEOUS) ×3 IMPLANT
SEAL ROD LENS SCOPE MYOSURE (ABLATOR) IMPLANT
SLEEVE SCD COMPRESS KNEE MED (STOCKING) ×3 IMPLANT
SYSTEM TISS REMOVAL MYOSURE XL (MISCELLANEOUS) IMPLANT
TOWEL OR 17X26 10 PK STRL BLUE (TOWEL DISPOSABLE) ×6 IMPLANT

## 2021-03-21 NOTE — Anesthesia Preprocedure Evaluation (Addendum)
Anesthesia Evaluation  Patient identified by MRN, date of birth, ID band Patient awake    Reviewed: Allergy & Precautions, NPO status , Patient's Chart, lab work & pertinent test results  Airway Mallampati: III  TM Distance: >3 FB Neck ROM: Full    Dental no notable dental hx.    Pulmonary neg pulmonary ROS,    Pulmonary exam normal breath sounds clear to auscultation       Cardiovascular negative cardio ROS Normal cardiovascular exam Rhythm:Regular Rate:Normal     Neuro/Psych negative neurological ROS  negative psych ROS   GI/Hepatic negative GI ROS, Neg liver ROS,   Endo/Other  negative endocrine ROS  Renal/GU negative Renal ROS     Musculoskeletal negative musculoskeletal ROS (+)   Abdominal   Peds  Hematology negative hematology ROS (+)   Anesthesia Other Findings ABNORMAL UTERINE BLEEDING Fibroids  Reproductive/Obstetrics hcg negative                           Anesthesia Physical Anesthesia Plan  ASA: 1  Anesthesia Plan: General   Post-op Pain Management:    Induction: Intravenous  PONV Risk Score and Plan: 4 or greater and Ondansetron, Dexamethasone, Midazolam and Treatment may vary due to age or medical condition  Airway Management Planned: LMA  Additional Equipment:   Intra-op Plan:   Post-operative Plan: Extubation in OR  Informed Consent: I have reviewed the patients History and Physical, chart, labs and discussed the procedure including the risks, benefits and alternatives for the proposed anesthesia with the patient or authorized representative who has indicated his/her understanding and acceptance.     Dental advisory given  Plan Discussed with: CRNA  Anesthesia Plan Comments:         Anesthesia Quick Evaluation

## 2021-03-21 NOTE — Anesthesia Postprocedure Evaluation (Signed)
Anesthesia Post Note  Patient: Gaylen Gerety  Procedure(s) Performed: Radio Frequency Ablation with Sonata (Uterus)     Patient location during evaluation: PACU Anesthesia Type: General Level of consciousness: awake Pain management: pain level controlled Vital Signs Assessment: post-procedure vital signs reviewed and stable Respiratory status: spontaneous breathing, nonlabored ventilation, respiratory function stable and patient connected to nasal cannula oxygen Cardiovascular status: blood pressure returned to baseline and stable Postop Assessment: no apparent nausea or vomiting Anesthetic complications: no   No notable events documented.  Last Vitals:  Vitals:   03/21/21 1145 03/21/21 1209  BP: 121/82 127/80  Pulse: 67 72  Resp: 15 14  Temp:  37.1 C  SpO2: 100% 100%    Last Pain:  Vitals:   03/21/21 1209  TempSrc:   PainSc: 5                  Joelie Schou P Jennel Mara

## 2021-03-21 NOTE — Op Note (Signed)
PREOPERATIVE DIAGNOSIS: Abnormal uterine bleeding, symptomatic uterine bleeding POSTOPERATIVE DIAGNOSIS: The same PROCEDURE:  Sonata fibroid ablation SURGEON:  Dr. Lynnda Shields  INDICATIONS: 30 y.o. G1P0101  here for Sonata Fibroid Ablation.  Risks of surgery were discussed with the patient including but not limited to: bleeding which may require transfusion; infection which may require antibiotics; injury to uterus leading to risk of injury to surrounding intraperitoneal organs, need for additional procedures including laparoscopy or laparotomy, and other postoperative/anesthesia complications. Written informed consent was obtained.   FINDINGS:  A 10 week size uterus.  Solitary intramural fibroid  ANESTHESIA:   General  INTRAVENOUS FLUIDS:  500 ml of LR ESTIMATED BLOOD LOSS:  Less than 10 ml UOP: 10 ml COMPLICATIONS:  None immediate  PROCEDURE DETAILS:  The patient was taken to the operating room where general anesthesia was administered and was found to be adequate.  After an adequate timeout was performed, she was placed in the dorsal lithotomy position and examined; then prepped and draped in the sterile manner.   Her bladder was catheterized for 10 ml of clear, yellow urine. A speculum was then placed in the patient's vagina and a single tooth tenaculum was applied to the anterior lip of the cervix.  A paracervical block using 0.5% Marcaine was administered. The sound was used to obtain the uterine cavity length measurements at 9 cm.  The cervix was dilated manually to 27 french with Hegar dilators   Once the cervix was dilated, the The sonata handpiece was then inserted and a complete uterine survery was performed with the intrauterine ultrasound.  Sonata fibroid ablation was then performed.  Fundal fibroid at 6 oclock.  3.5 cm  3 ablations were performed.  Ablation 1:  2.4 x 1.7 cm  2 minutes Ablation 2   2.9 x 2.1 cm  2 minutes 48 seconds Ablation 3  2.9 x 2.1 cm  2 minutes 48  seconds  The patient tolerated the procedure well and was taken to the recovery area awake, extubated and in stable condition.  The patient will be discharged to home as per PACU criteria.  Routine postoperative instructions given.  She was prescribed  Ibuprofen.  She will follow up in the clinic in 1 month for postoperative evaluation.    Lynnda Shields, MD Faculty Attending Center for [facility]

## 2021-03-21 NOTE — Interval H&P Note (Signed)
History and Physical Interval Note:  03/21/2021 9:42 AM  Deborah Craig  has presented today for surgery, with the diagnosis of ABNORMAL UTERINE BLEEDING, fibroids.  The various methods of treatment have been discussed with the patient and family. After consideration of risks, benefits and other options for treatment, the patient has consented to  Procedure(s): Radio Frequency Ablation with Sonata (N/A) HYSTEROSCOPY- possible for use with Sonata (N/A) as a surgical intervention.  The patient's history has been reviewed, patient examined, no change in status, stable for surgery.  I have reviewed the patient's chart and labs.  Questions were answered to the patient's satisfaction.     Deborah Craig

## 2021-03-21 NOTE — Anesthesia Procedure Notes (Signed)
Procedure Name: LMA Insertion Date/Time: 03/21/2021 10:17 AM Performed by: Rogers Blocker, CRNA Pre-anesthesia Checklist: Patient identified, Emergency Drugs available, Suction available and Patient being monitored Patient Re-evaluated:Patient Re-evaluated prior to induction Oxygen Delivery Method: Circle System Utilized Preoxygenation: Pre-oxygenation with 100% oxygen Induction Type: IV induction Ventilation: Mask ventilation without difficulty LMA: LMA inserted LMA Size: 4.0 Number of attempts: 1 Airway Equipment and Method: Bite block Placement Confirmation: positive ETCO2 Tube secured with: Tape Dental Injury: Teeth and Oropharynx as per pre-operative assessment

## 2021-03-21 NOTE — Transfer of Care (Signed)
Immediate Anesthesia Transfer of Care Note  Patient: Deborah Craig  Procedure(s) Performed: Radio Frequency Ablation with Sonata (Uterus)  Patient Location: PACU  Anesthesia Type:General  Level of Consciousness: drowsy, patient cooperative and responds to stimulation  Airway & Oxygen Therapy: Patient Spontanous Breathing  Post-op Assessment: Report given to RN and Post -op Vital signs reviewed and stable  Post vital signs: Reviewed and stable  Last Vitals:  Vitals Value Taken Time  BP 122/72 03/21/21 1115  Temp 36.8 C 03/21/21 1115  Pulse 70 03/21/21 1120  Resp 17 03/21/21 1120  SpO2 100 % 03/21/21 1120  Vitals shown include unvalidated device data.  Last Pain:  Vitals:   03/21/21 1115  TempSrc:   PainSc: 0-No pain      Patients Stated Pain Goal: 6 (14/10/30 1314)  Complications: No notable events documented.

## 2021-03-21 NOTE — Discharge Instructions (Addendum)
   Pelvic rest 3 weeks. Some light cramping and vaginal spotting is to be expected. Continue megace until the end of October and then discontinue  DISCHARGE INSTRUCTIONS: HYSTEROSCOPY / ENDOMETRIAL ABLATION The following instructions have been prepared to help you care for yourself upon your return home.   May take stool softner while taking narcotic pain medication to prevent constipation.  Drink plenty of water.  Personal hygiene:  Use sanitary pads for vaginal drainage, not tampons.  Shower the day after your procedure.  NO tub baths, pools or Jacuzzis for 2-3 weeks.  Wipe front to back after using the bathroom.  Activity and limitations:  Do NOT drive or operate any equipment for 24 hours. The effects of anesthesia are still present and drowsiness may result.  Do NOT rest in bed all day.  Walking is encouraged.  Walk up and down stairs slowly.  You may resume your normal activity in one to two days or as indicated by your physician. Sexual activity: NO intercourse for at least 2 weeks after the procedure, or as indicated by your Doctor.  Diet: Eat a light meal as desired this evening. You may resume your usual diet tomorrow.  Return to Work: You may resume your work activities in one to two days or as indicated by Marine scientist.  What to expect after your surgery: Expect to have vaginal bleeding/discharge for 2-3 days and spotting for up to 10 days. It is not unusual to have soreness for up to 1-2 weeks. You may have a slight burning sensation when you urinate for the first day. Mild cramps may continue for a couple of days. You may have a regular period in 2-6 weeks.  Call your doctor for any of the following:  Excessive vaginal bleeding or clotting, saturating and changing one pad every hour.  Inability to urinate 6 hours after discharge from hospital.  Pain not relieved by pain medication.  Fever of 100.4 F or greater.  Unusual vaginal discharge or odor.   Post  Anesthesia Home Care Instructions  Activity: Get plenty of rest for the remainder of the day. A responsible individual must stay with you for 24 hours following the procedure.  For the next 24 hours, DO NOT: -Drive a car -Paediatric nurse -Drink alcoholic beverages -Take any medication unless instructed by your physician -Make any legal decisions or sign important papers.  Meals: Start with liquid foods such as gelatin or soup. Progress to regular foods as tolerated. Avoid greasy, spicy, heavy foods. If nausea and/or vomiting occur, drink only clear liquids until the nausea and/or vomiting subsides. Call your physician if vomiting continues.  Special Instructions/Symptoms: Your throat may feel dry or sore from the anesthesia or the breathing tube placed in your throat during surgery. If this causes discomfort, gargle with warm salt water. The discomfort should disappear within 24 hours.      No Tylenol until after 2 pm today if needed.     No ibuprofen, Advil, Aleve, Motrin, ketorolac, meloxicam, or naproxen until after 5 pm today if needed.

## 2021-04-27 ENCOUNTER — Other Ambulatory Visit: Payer: Self-pay

## 2021-04-27 ENCOUNTER — Ambulatory Visit (INDEPENDENT_AMBULATORY_CARE_PROVIDER_SITE_OTHER): Payer: Medicaid Other | Admitting: Obstetrics and Gynecology

## 2021-04-27 ENCOUNTER — Encounter: Payer: Self-pay | Admitting: Obstetrics and Gynecology

## 2021-04-27 VITALS — BP 118/62 | HR 71 | Wt 159.0 lb

## 2021-04-27 DIAGNOSIS — Z4889 Encounter for other specified surgical aftercare: Secondary | ICD-10-CM | POA: Insufficient documentation

## 2021-04-27 DIAGNOSIS — D251 Intramural leiomyoma of uterus: Secondary | ICD-10-CM

## 2021-04-27 NOTE — Progress Notes (Signed)
    Subjective:    Deborah Craig is a 30 y.o. female who presents to the clinic status post Sonata procedure on 03/21/21. The patient is not having any pain.  Eating a regular diet without difficulty. Bowel movements are normal. No other significant postoperative concerns.  The following portions of the patient's history were reviewed and updated as appropriate: allergies, current medications, past family history, past medical history, past social history, past surgical history, and problem list..  Last pap smear was normal on 08/05/19.  Review of Systems Pertinent items are noted in HPI.   Objective:   BP 118/62   Pulse 71   Wt 159 lb (72.1 kg)   BMI 30.04 kg/m  Constitutional:  Well-developed, well-nourished female in no acute distress.   Skin: Skin is warm and dry, no rash noted, not diaphoretic,no erythema, no pallor.  Cardiovascular: Normal heart rate noted  Respiratory: Effort and breath sounds normal, no problems with respiration noted  Abdomen: Soft, bowel sounds active, non-tender, no abnormal masses  Incision: N/a  Pelvic:    Vagina WNL, cervix WNL, no CMT, no vaginal secretions   Surgical pathology (n/a)  Assessment:   Doing well postoperatively.  Operative findings again reviewed. Pathology report discussed.   Plan:   1. Continue any current medications. 2. Wound care discussed. 3. Activity restrictions: none 4. Anticipated return to work: now. 5. Follow up in 4 months with virtual visit, follow up u/s in 3 months 6.  Routine preventative health maintenance measures emphasized. Please refer to After Visit Summary for other counseling recommendations.    Lynnda Shields, MD, [place] Attending [place] for [facility], [place]

## 2021-07-06 ENCOUNTER — Telehealth: Payer: Self-pay

## 2021-07-06 DIAGNOSIS — Z114 Encounter for screening for human immunodeficiency virus [HIV]: Secondary | ICD-10-CM

## 2021-07-06 NOTE — Telephone Encounter (Signed)
Spoke to patient to schedule lab appointment per Dr. Elgie Congo.  Patient is aware appointment will appear on MyChart. Informed patient she can give [place] a call back or MyChart [place] if the time and date do not work for her.  Patient agreed.  Francisco [facility] 01/27/223 1221

## 2021-07-11 ENCOUNTER — Other Ambulatory Visit: Payer: Medicaid Other

## 2021-07-16 ENCOUNTER — Other Ambulatory Visit: Payer: Medicaid Other

## 2021-07-16 ENCOUNTER — Other Ambulatory Visit: Payer: Self-pay

## 2021-07-16 DIAGNOSIS — Z114 Encounter for screening for human immunodeficiency virus [HIV]: Secondary | ICD-10-CM

## 2021-07-17 LAB — HIV ANTIBODY (ROUTINE TESTING W REFLEX): HIV Screen 4th Generation wRfx: NONREACTIVE

## 2021-07-27 ENCOUNTER — Ambulatory Visit (HOSPITAL_COMMUNITY)
Admission: RE | Admit: 2021-07-27 | Discharge: 2021-07-27 | Disposition: A | Discharge status: Home / Self Care | Payer: Medicaid Other | Source: Ambulatory Visit | Attending: Obstetrics and Gynecology | Admitting: Obstetrics and Gynecology

## 2021-07-27 ENCOUNTER — Other Ambulatory Visit: Payer: Self-pay

## 2021-07-27 DIAGNOSIS — D251 Intramural leiomyoma of uterus: Secondary | ICD-10-CM | POA: Diagnosis not present

## 2021-07-27 DIAGNOSIS — N858 Other specified noninflammatory disorders of uterus: Secondary | ICD-10-CM | POA: Diagnosis not present

## 2021-07-27 DIAGNOSIS — D250 Submucous leiomyoma of uterus: Secondary | ICD-10-CM | POA: Diagnosis not present

## 2021-07-31 ENCOUNTER — Encounter: Payer: Self-pay | Admitting: Obstetrics and Gynecology

## 2021-10-09 IMAGING — US US PELVIS COMPLETE WITH TRANSVAGINAL
1 series · 15 of 25 positions shown · non-contrast
Comparison: 01/10/2020

CLINICAL DATA: Uterine fibroids, follow-up, on Depo Lupron. History
of D&C, hysteroscopy and MyoSure; LMP 06/24/2020

EXAM:
TRANSABDOMINAL AND TRANSVAGINAL ULTRASOUND OF PELVIS
TECHNIQUE: Both transabdominal and transvaginal ultrasound examinations of the
pelvis were performed. Transabdominal technique was performed for
global imaging of the pelvis including uterus, ovaries, adnexal
regions, and pelvic cul-de-sac. It was necessary to proceed with
endovaginal exam following the transabdominal exam to visualize the
endometrium and uterus.

[Series 1: us pelvis complete with transvaginal · 15 of 123 slices shown]
[im 1/123]
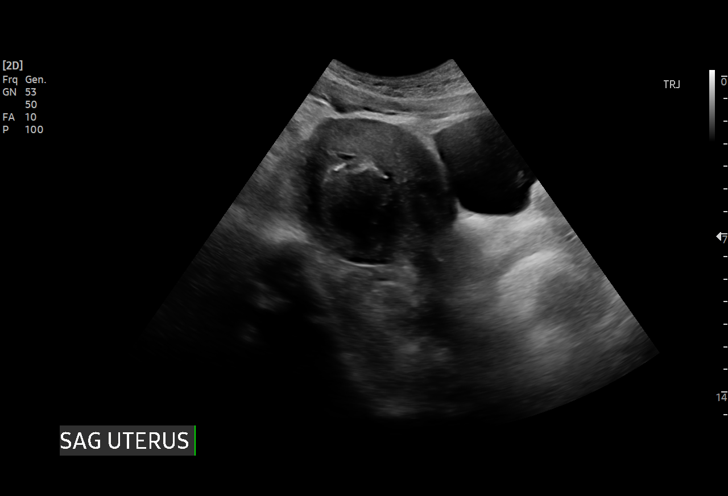
[im 11/123]
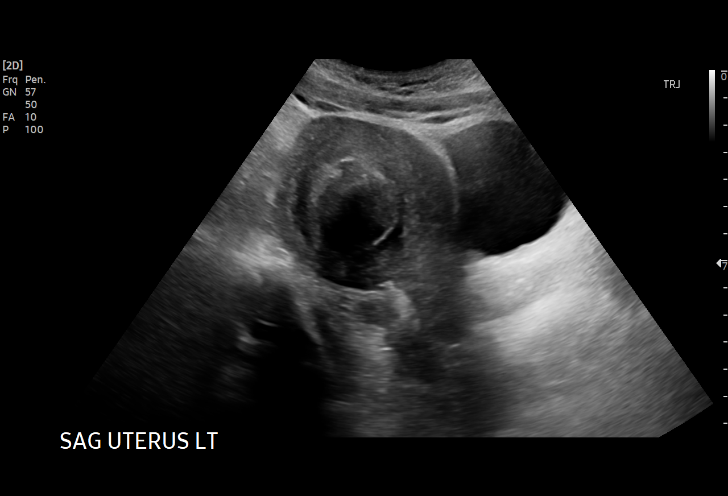
[im 21/123]
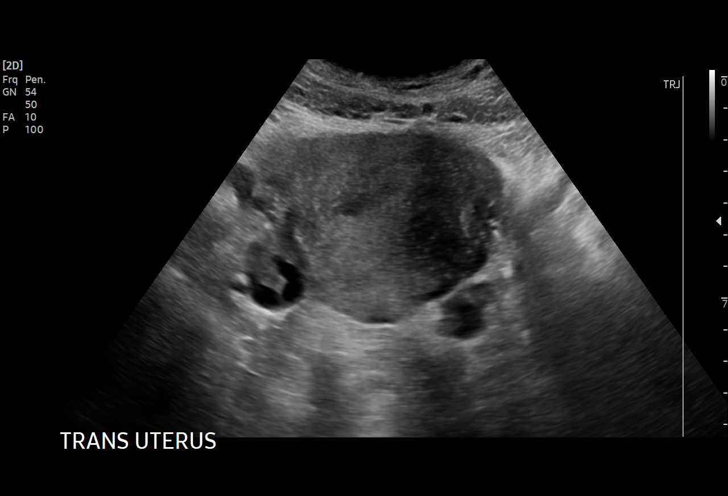
[im 26/123]
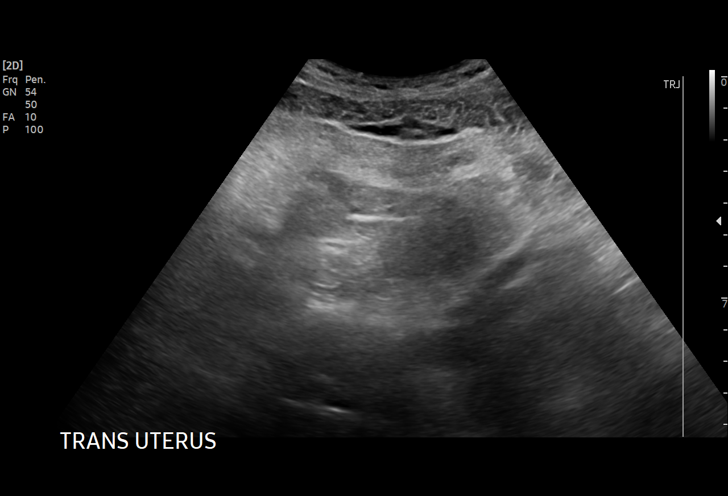
[im 36/123]
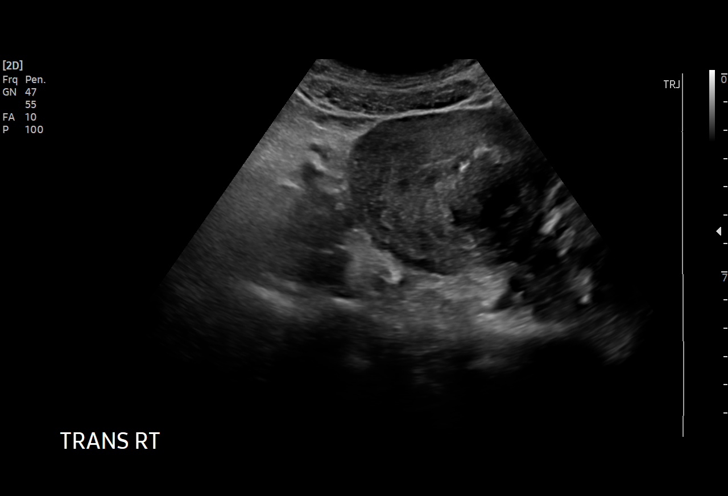
[im 46/123]
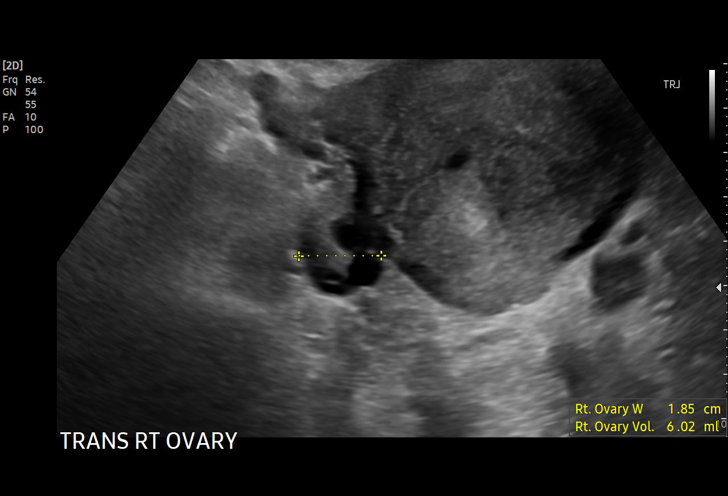
[im 51/123]
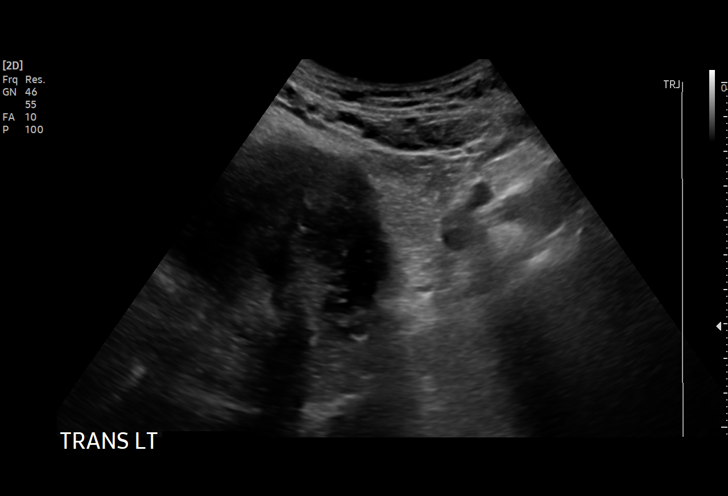
[im 62/123]
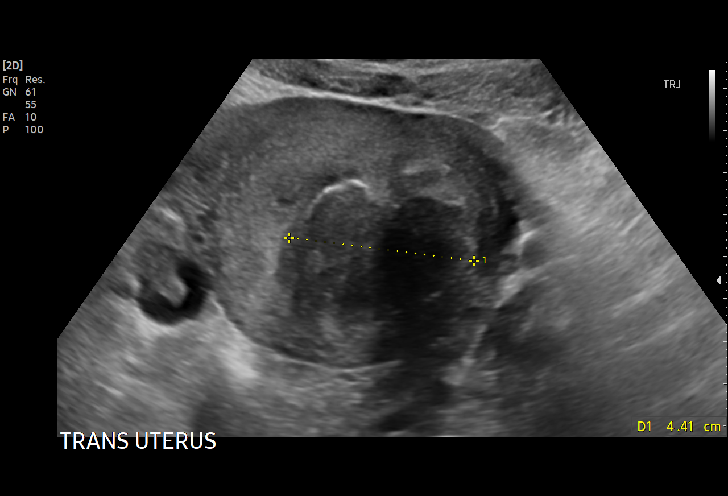
[im 72/123]
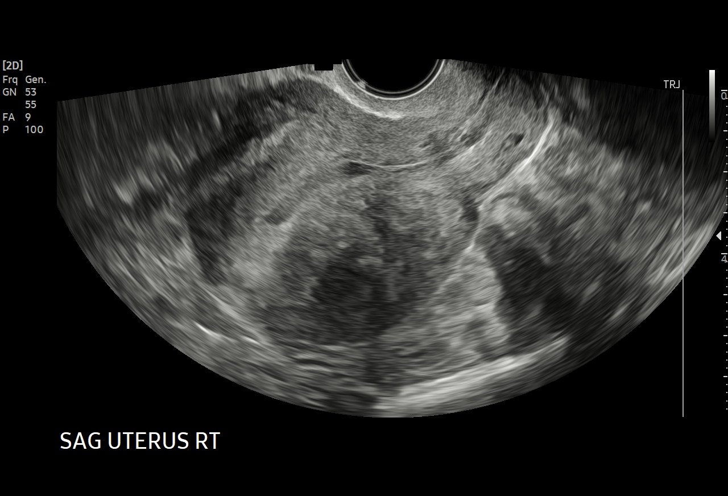
[im 77/123]
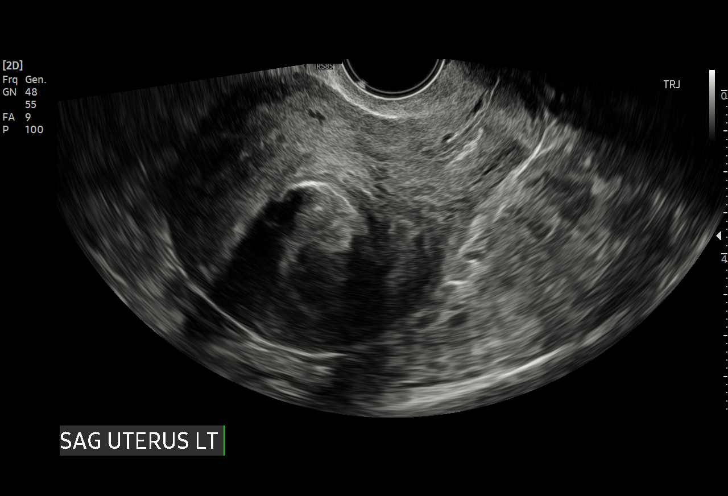
[im 87/123]
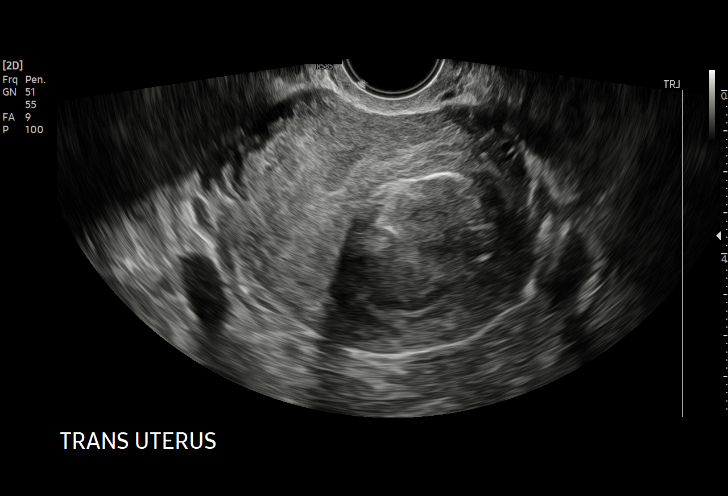
[im 97/123]
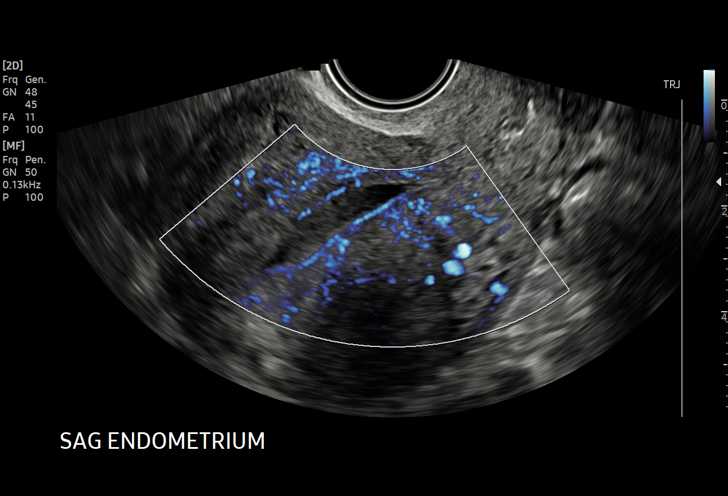
[im 102/123]
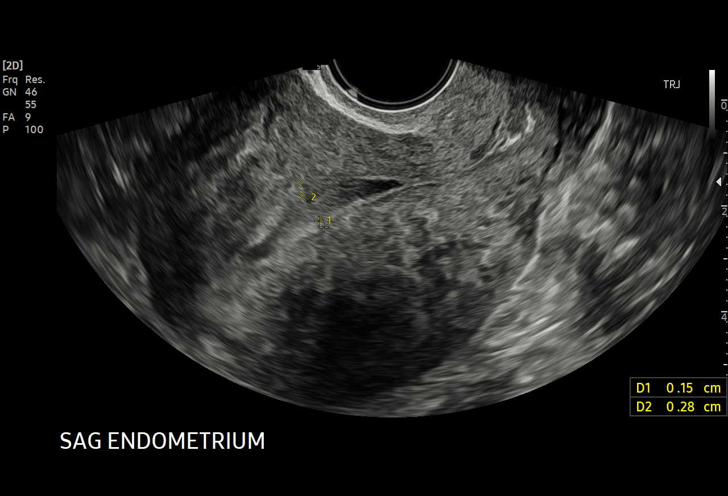
[im 112/123]
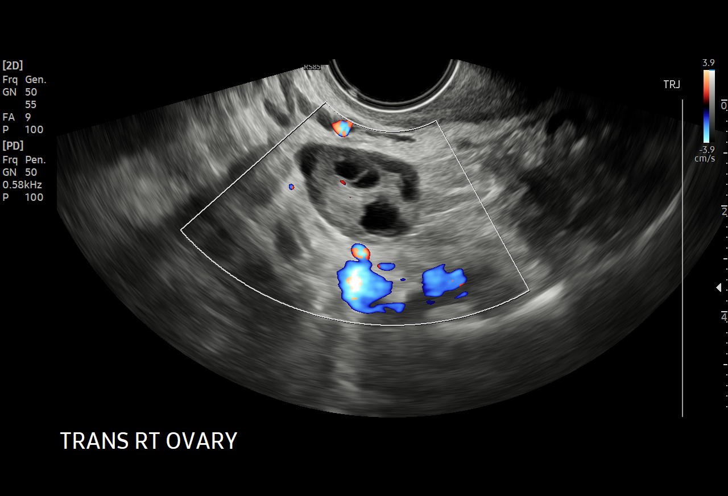
[im 123/123]
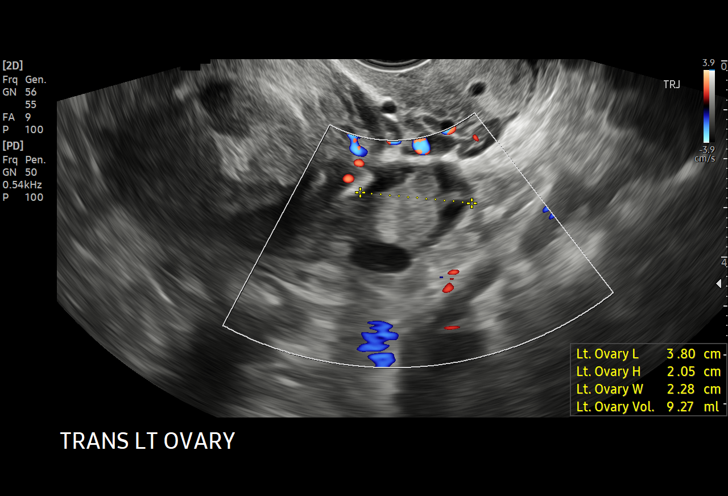

[15 of 25 positions shown; findings below may reference images not displayed]

FINDINGS: Uterus

Measurements: 10.2 x 6.1 x 8.0 cm = volume: 262 mL. Anteverted.
Heterogeneous myometrium. Large submucosal leiomyoma at posterior
upper uterus 4.9 x 3.9 x 4.6 cm.

Endometrium

Thickness: 4 mm. Displaced anteriorly by leiomyoma. Small amount of
complex fluid or blood within endometrial canal.

Right ovary

Measurements: 3.4 x 2.0 x 2.5 cm = volume: 8.5 mL. Normal morphology
without mass

Left ovary

Measurements: 3.8 x 2.1 x 2.3 cm = volume: 9.3 mL. Normal morphology
without mass

Other findings

No free pelvic fluid or adnexal masses.
IMPRESSION: Large submucosal leiomyoma at posterior upper uterus 4.9 cm greatest
size, displacing endometrial complex anteriorly; previously this
leiomyoma measured 7.3 x 6.7 x 6.9 cm.

Small amount of complex fluid or blood within endometrial canal.

Unremarkable ovaries and adnexa.

## 2021-12-12 ENCOUNTER — Other Ambulatory Visit: Payer: Self-pay | Admitting: Obstetrics & Gynecology

## 2022-10-10 IMAGING — US US PELVIS COMPLETE WITH TRANSVAGINAL
1 series · 13 of 25 positions shown · non-contrast
Comparison: 07/26/2020

CLINICAL DATA: Fibroid uterus, LMP 07/23/2021

EXAM:
TRANSABDOMINAL AND TRANSVAGINAL ULTRASOUND OF PELVIS
TECHNIQUE: Both transabdominal and transvaginal ultrasound examinations of the
pelvis were performed. Transabdominal technique was performed for
global imaging of the pelvis including uterus, ovaries, adnexal
regions, and pelvic cul-de-sac. It was necessary to proceed with
endovaginal exam following the transabdominal exam to visualize the
lower uterine segment and cervix.

[Series 1: us pelvic complete with transvaginal · 13 of 122 slices shown]
[im 1/122]
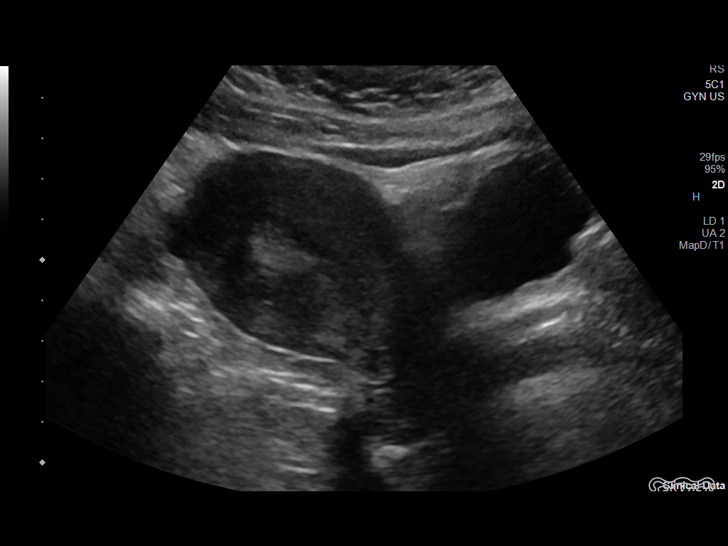
[im 11/122]
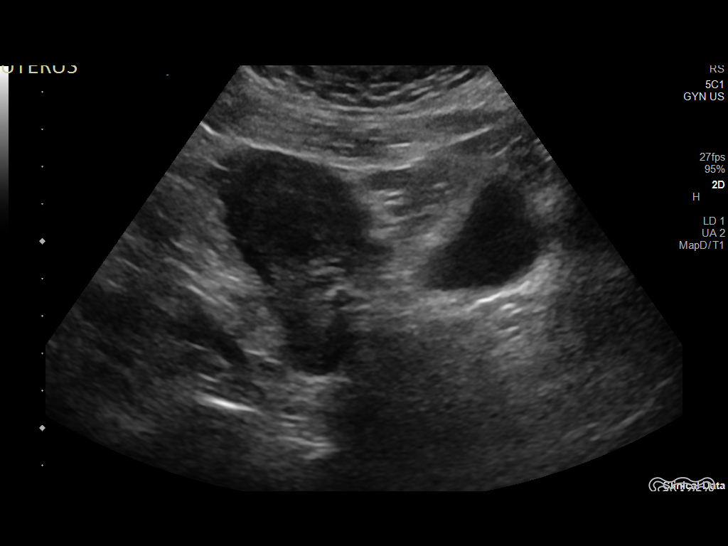
[im 21/122]
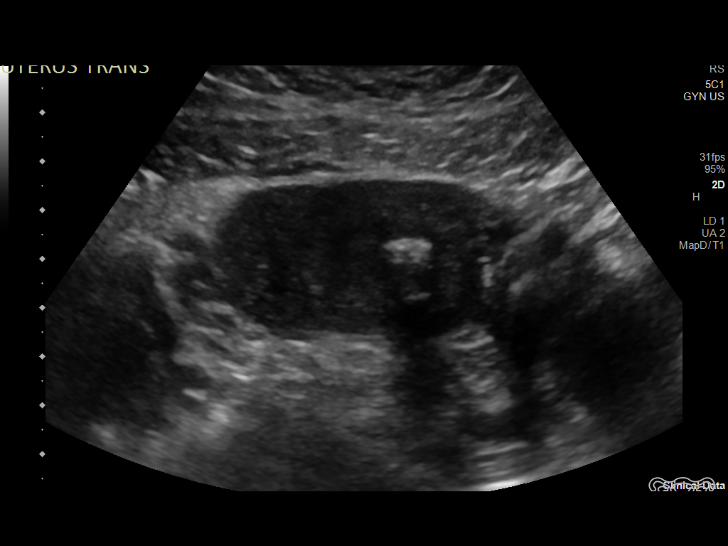
[im 31/122]
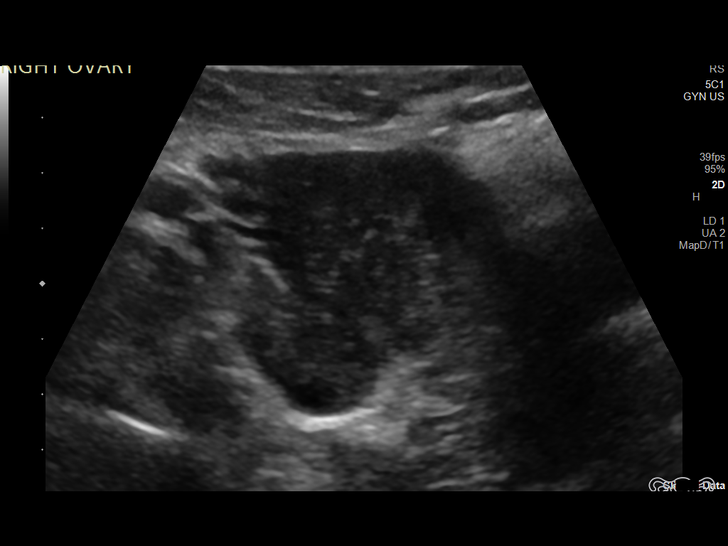
[im 41/122]
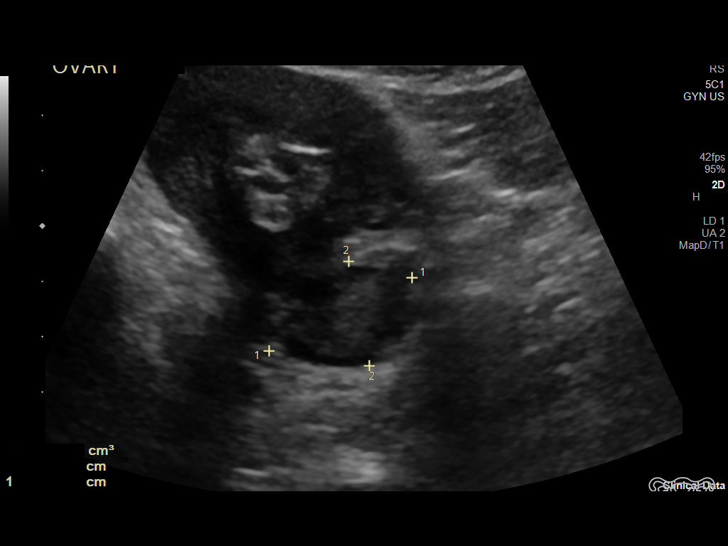
[im 51/122]
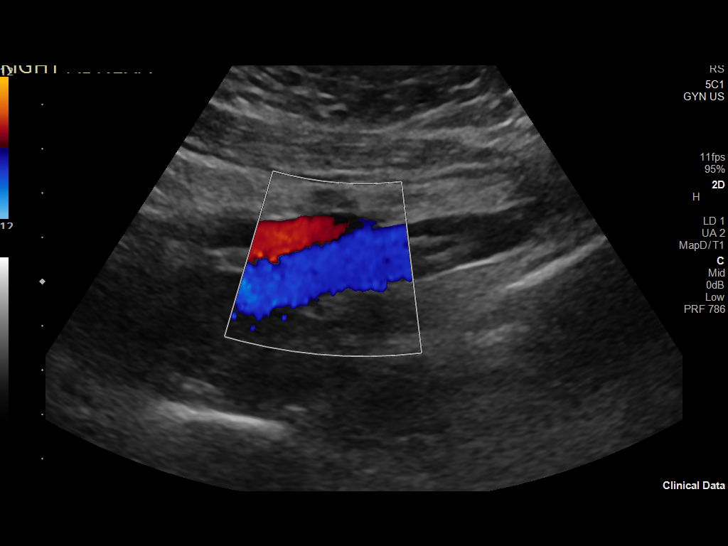
[im 61/122]
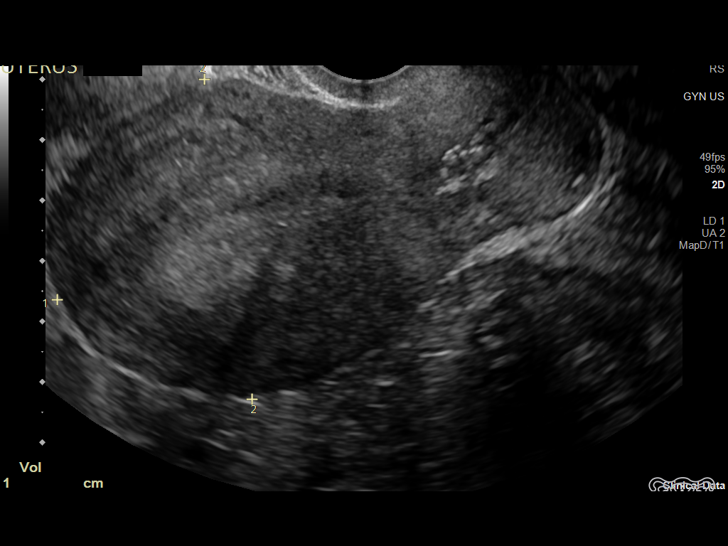
[im 71/122]
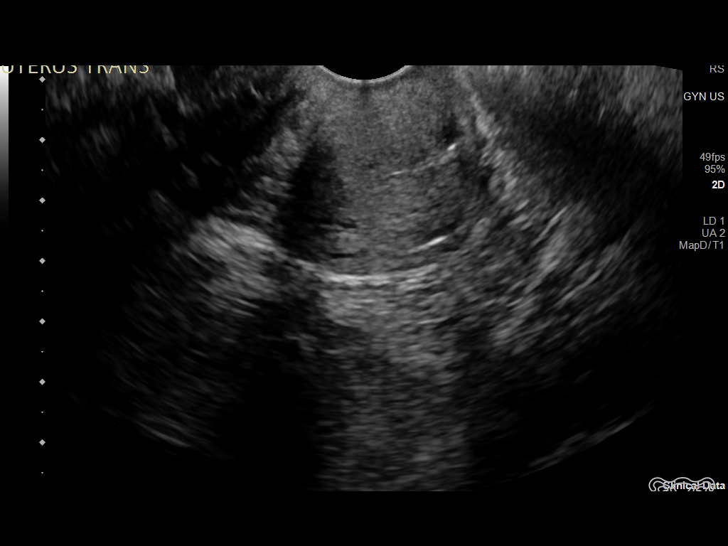
[im 81/122]
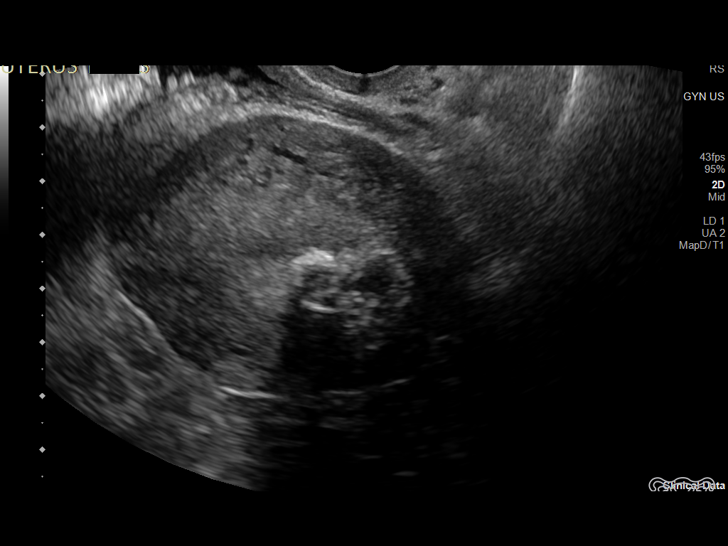
[im 91/122]
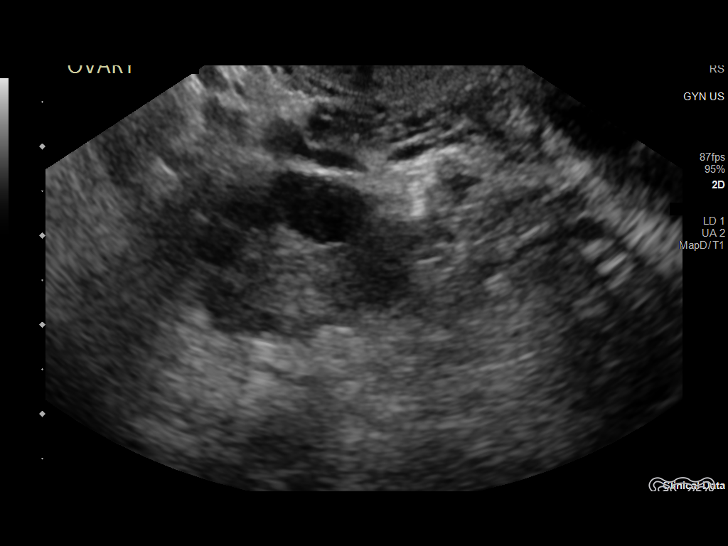
[im 101/122]
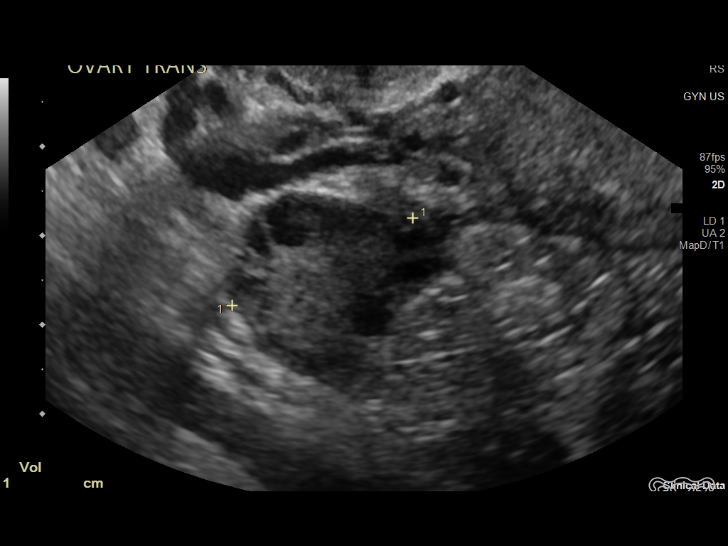
[im 111/122]
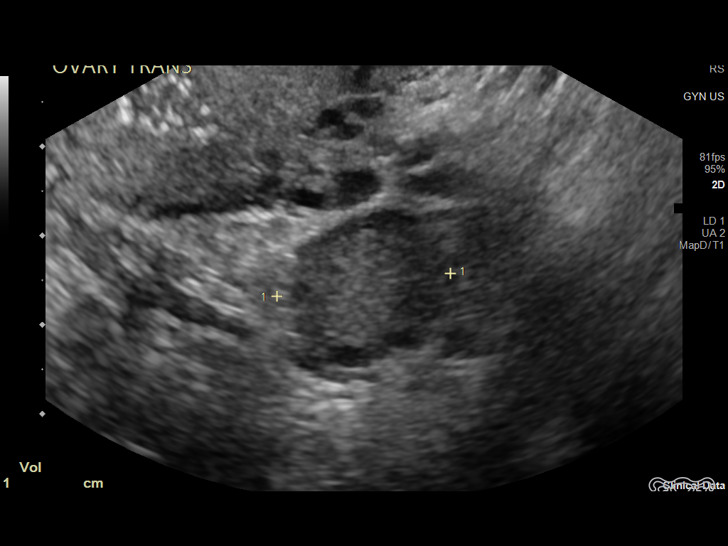
[im 122/122]
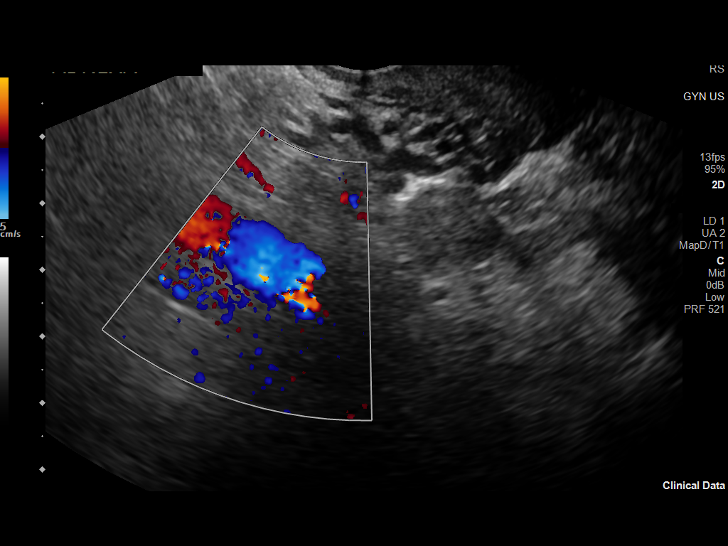

[13 of 25 positions shown; findings below may reference images not displayed]

FINDINGS: Uterus

Measurements: 10.3 x 4.5 x 6.3 cm = volume: 152 mL. Anteverted.
Heterogeneous myometrium. Submucosal leiomyoma posterior upper
uterus, partially calcified, 3.4 x 2.8 x 2.6 cm. Additional small
intramural leiomyoma anterior uterus 10 mm diameter.

Endometrium

Thickness: 10 mm.  No endometrial fluid or mass

Right ovary

Measurements: 3.2 x 2.2 x 2.3 cm = volume: 8.4 mL. Normal morphology
without mass

Left ovary

Measurements: 3.1 x 1.8 x 2.0 cm = volume: 5.7 mL. Normal morphology
without mass

Other findings

No free pelvic fluid.  No adnexal masses.
IMPRESSION: Two uterine leiomyomata, larger 3.4 cm diameter partially calcified
and submucosal at posterior upper uterus.

Unremarkable endometrial complex and ovaries.

## 2024-03-08 ENCOUNTER — Ambulatory Visit: Admitting: *Deleted

## 2024-03-08 DIAGNOSIS — Z3201 Encounter for pregnancy test, result positive: Secondary | ICD-10-CM

## 2024-03-08 DIAGNOSIS — Z3200 Encounter for pregnancy test, result unknown: Secondary | ICD-10-CM

## 2024-03-08 LAB — POCT PREGNANCY, URINE: Preg Test, Ur: POSITIVE — AB

## 2024-03-08 NOTE — Progress Notes (Signed)
 Pt submitted urine for pregnancy test which is positive. I called and notified her of result. She reports approximate LMP 02/03/24 which yields EDD 11/09/24, now [redacted]w[redacted]d. She denies having abdominal pain or vaginal bleeding. She was advised to go to MAU if she develops these issues. Pt will be contacted with scheduled appointments for initial prenatal care. She voiced understanding.

## 2024-03-28 ENCOUNTER — Encounter (HOSPITAL_COMMUNITY): Payer: Self-pay | Admitting: *Deleted

## 2024-03-28 ENCOUNTER — Inpatient Hospital Stay (HOSPITAL_COMMUNITY)

## 2024-03-28 ENCOUNTER — Inpatient Hospital Stay (HOSPITAL_COMMUNITY)
Admission: AD | Admit: 2024-03-28 | Discharge: 2024-03-29 | Disposition: A | Attending: Obstetrics and Gynecology | Admitting: Obstetrics and Gynecology

## 2024-03-28 DIAGNOSIS — O209 Hemorrhage in early pregnancy, unspecified: Secondary | ICD-10-CM

## 2024-03-28 DIAGNOSIS — O208 Other hemorrhage in early pregnancy: Secondary | ICD-10-CM | POA: Insufficient documentation

## 2024-03-28 DIAGNOSIS — Z3A01 Less than 8 weeks gestation of pregnancy: Secondary | ICD-10-CM | POA: Diagnosis not present

## 2024-03-28 DIAGNOSIS — Z3A08 8 weeks gestation of pregnancy: Secondary | ICD-10-CM | POA: Diagnosis not present

## 2024-03-28 DIAGNOSIS — Z3491 Encounter for supervision of normal pregnancy, unspecified, first trimester: Secondary | ICD-10-CM

## 2024-03-28 LAB — URINALYSIS, ROUTINE W REFLEX MICROSCOPIC
Bacteria, UA: NONE SEEN
Bilirubin Urine: NEGATIVE
Glucose, UA: NEGATIVE mg/dL
Ketones, ur: 5 mg/dL — AB
Leukocytes,Ua: NEGATIVE
Nitrite: NEGATIVE
Protein, ur: NEGATIVE mg/dL
Specific Gravity, Urine: 1.011 (ref 1.005–1.030)
pH: 6 (ref 5.0–8.0)

## 2024-03-28 LAB — WET PREP, GENITAL
Clue Cells Wet Prep HPF POC: NONE SEEN
Sperm: NONE SEEN
Trich, Wet Prep: NONE SEEN
WBC, Wet Prep HPF POC: <10 (ref <10)
Yeast Wet Prep HPF POC: NONE SEEN

## 2024-03-28 LAB — CBC
HCT: 40.4 % (ref 36.0–46.0)
Hemoglobin: 13.6 g/dL (ref 12.0–15.0)
MCH: 29.6 pg (ref 26.0–34.0)
MCHC: 33.7 g/dL (ref 30.0–36.0)
MCV: 87.8 fL (ref 80.0–100.0)
Platelets: 365 K/uL (ref 150–400)
RBC: 4.6 MIL/uL (ref 3.87–5.11)
RDW: 12.4 % (ref 11.5–15.5)
WBC: 12.4 K/uL — ABNORMAL HIGH (ref 4.0–10.5)
nRBC: 0 % (ref 0.0–0.2)

## 2024-03-28 LAB — RAPID HIV SCREEN (HIV 1/2 AB+AG)
HIV 1/2 Antibodies: NONREACTIVE
HIV-1 P24 Antigen - HIV24: NONREACTIVE

## 2024-03-28 NOTE — MAU Note (Signed)
 Deborah Craig is a 33 y.o. at [redacted]w[redacted]d here in MAU reporting: felt wetness coming ot of her vagina tonight Denies pain  or cramping no recent intercourse. Wens she left urine sample just now in MAU no bleeding noted.   LMP: 02/03/24 Onset of complaint: tonight Pain score: 0 Vitals:   03/28/24 2245  BP: 123/69  Pulse: 72  Resp: 18  Temp: 98.3 F (36.8 C)     FHT: n/a  Lab orders placed from triage: wet prep , gc, urine

## 2024-03-28 NOTE — MAU Provider Note (Signed)
 Chief Complaint: Vaginal Bleeding   Event Date/Time   First Provider Initiated Contact with Patient 03/29/24 0107        SUBJECTIVE HPI: Deborah Craig is a 33 y.o. G2P0101 at [redacted]w[redacted]d who presents to Maternity Admissions reporting vaginal bleeding. No addition bleeding since arrival to MAU. Denies abd pain.   O POS  Associated signs and symptoms: none  Past Medical History:  Diagnosis Date   Abnormal uterine bleeding (AUB) 07/11/2019   Anemia 07/11/2019   SYMPTOMATIC   History of blood transfusion    8 months ago for anemia per pt on 03-14-2021   Iron  deficiency anemia 07/11/2019   Uterine leiomyoma 08/05/2019   OB History  Gravida Para Term Preterm AB Living  2 1  1  1   SAB IAB Ectopic Multiple Live Births      1    # Outcome Date GA Lbr Len/2nd Weight Sex Type Anes PTL Lv  2 Current           1 Preterm 09/14/11 [redacted]w[redacted]d 18:44 / 00:18 2954 g F Vag-Spont None  LIV     Birth Comments: wnl   Past Surgical History:  Procedure Laterality Date   DILATATION & CURETTAGE/HYSTEROSCOPY WITH MYOSURE N/A 07/12/2020   Procedure: DILATATION & CURETTAGE/HYSTEROSCOPY;  Surgeon: Izell Harari, MD;  Location: [facility];  Service: Gynecology;  Laterality: N/A;   WISDOM TOOTH EXTRACTION     10 yrs ago   Social History   Socioeconomic History   Marital status: Single    Spouse name: Not on file   Number of children: Not on file   Years of education: Not on file   Highest education level: Not on file  Occupational History   Not on file  Tobacco Use   Smoking status: Never   Smokeless tobacco: Never  Vaping Use   Vaping status: Never Used  Substance and Sexual Activity   Alcohol use: No   Drug use: No   Sexual activity: Yes    Birth control/protection: None  Other Topics Concern   Not on file  Social History Narrative   Not on file   Social Drivers of Health   Financial Resource Strain: Not on file  Food Insecurity: No Food Insecurity (04/24/2020)   Hunger  Vital Sign    Worried About Running Out of Food in the Last Year: Never true    Ran Out of Food in the Last Year: Never true  Transportation Needs: No Transportation Needs (04/24/2020)   PRAPARE - Administrator, Civil Service (Medical): No    Lack of Transportation (Non-Medical): No  Physical Activity: Not on file  Stress: Not on file  Social Connections: Not on file  Intimate Partner Violence: Not on file   No current facility-administered medications on file prior to encounter.   Current Outpatient Medications on File Prior to Encounter  Medication Sig Dispense Refill   Prenatal Vit-Fe Fumarate-FA (PRENATAL MULTIVITAMIN) TABS tablet Take 1 tablet by mouth daily at 12 noon.     No Known Allergies  I have reviewed the past Medical Hx, Surgical Hx, Social Hx, Allergies and Medications.   Review of Systems  Constitutional:  Negative for chills and fever.  Gastrointestinal:  Negative for abdominal pain, nausea and vomiting.  Genitourinary:  Positive for vaginal bleeding. Negative for difficulty urinating, dyspareunia, dysuria, frequency, genital sores, hematuria, menstrual problem, pelvic pain, urgency and vaginal discharge.    OBJECTIVE Patient Vitals for the past 24 hrs:  BP  Temp Pulse Resp Height  03/28/24 2245 123/69 98.3 F (36.8 C) 72 18 5' 1 (1.549 m)   Constitutional: Well-developed, well-nourished female in no acute distress.  Cardiovascular: normal rate Respiratory: normal rate and effort.  GI: Abd soft, non-tender.  MS: Extremities nontender, no edema, normal ROM Neurologic: Alert and oriented x 4.  GU: deferred  LAB RESULTS Results for orders placed or performed during the hospital encounter of 03/28/24 (from the past 24 hours)  Urinalysis, Routine w reflex microscopic -Urine, Clean Catch     Status: Abnormal   Collection Time: 03/28/24 10:31 PM  Result Value Ref Range   Color, Urine YELLOW YELLOW   APPearance CLEAR CLEAR   Specific Gravity,  Urine 1.011 1.005 - 1.030   pH 6.0 5.0 - 8.0   Glucose, UA NEGATIVE NEGATIVE mg/dL   Hgb urine dipstick LARGE (A) NEGATIVE   Bilirubin Urine NEGATIVE NEGATIVE   Ketones, ur 5 (A) NEGATIVE mg/dL   Protein, ur NEGATIVE NEGATIVE mg/dL   Nitrite NEGATIVE NEGATIVE   Leukocytes,Ua NEGATIVE NEGATIVE   RBC / HPF 0-5 0 - 5 RBC/hpf   WBC, UA 0-5 0 - 5 WBC/hpf   Bacteria, UA NONE SEEN NONE SEEN   Squamous Epithelial / HPF 0-5 0 - 5 /HPF   Mucus PRESENT   Wet prep, genital     Status: None   Collection Time: 03/28/24 10:33 PM  Result Value Ref Range   Yeast Wet Prep HPF POC NONE SEEN NONE SEEN   Trich, Wet Prep NONE SEEN NONE SEEN   Clue Cells Wet Prep HPF POC NONE SEEN NONE SEEN   WBC, Wet Prep HPF POC <10 <10   Sperm NONE SEEN   hCG, quantitative, pregnancy     Status: Abnormal   Collection Time: 03/28/24 11:08 PM  Result Value Ref Range   hCG, Beta Chain, Quant, S 184,562 (H) <5 mIU/mL  CBC     Status: Abnormal   Collection Time: 03/28/24 11:08 PM  Result Value Ref Range   WBC 12.4 (H) 4.0 - 10.5 K/uL   RBC 4.60 3.87 - 5.11 MIL/uL   Hemoglobin 13.6 12.0 - 15.0 g/dL   HCT 59.5 63.9 - 53.9 %   MCV 87.8 80.0 - 100.0 fL   MCH 29.6 26.0 - 34.0 pg   MCHC 33.7 30.0 - 36.0 g/dL   RDW 87.5 88.4 - 84.4 %   Platelets 365 150 - 400 K/uL   nRBC 0.0 0.0 - 0.2 %  Rapid HIV screen (HIV 1/2 Ab+Ag)     Status: None   Collection Time: 03/28/24 11:08 PM  Result Value Ref Range   HIV-1 P24 Antigen - HIV24 NON REACTIVE NON REACTIVE   HIV 1/2 Antibodies NON REACTIVE NON REACTIVE   Interpretation (HIV Ag Ab)      A non reactive test result means that HIV 1 or HIV 2 antibodies and HIV 1 p24 antigen were not detected in the specimen.    IMAGING US  OB Comp Less 14 Wks Result Date: 03/28/2024 EXAM: OBSTETRIC ULTRASOUND FIRST TRIMESTER TECHNIQUE: Transabdominal first trimester obstetric pelvic duplex ultrasound was performed. COMPARISON: None provided. CLINICAL HISTORY: Vaginal bleeding in pregnancy,  first trimester. FINDINGS: UTERUS: Small calcified fibroid in the uterus measuring 1.2 cm. GESTATIONAL SAC(S): Single normal appearing intrauterine gestational sac. Subchorionic hemorrhage is small. YOLK SAC: Present EMBRYO(<11WK) /FETUS(>=11WK): Single embryo. CROWN RUMP LENGTH: 18.9 mm corresponding to a 18-week 3-day gestational age. RATE OF CARDIAC ACTIVITY: 180 beats per minute. RIGHT OVARY: Normal ovaries.  Normal arterial and venous flow. LEFT OVARY: Normal ovaries. Normal arterial and venous flow. FREE FLUID: No free fluid. MEASUREMENTS ESTIMATED GESTATIONAL AGE BY CURRENT ULTRASOUND: 18 weeks 3 days ESTIMATED DUE DATE: 08/07/2024 IMPRESSION: 1. Single live intrauterine gestation with yolk sac and embryo; crown-rump length 18.9 mm corresponds to gestational age of [redacted] weeks 3 days; fetal heart rate 180 bpm; EDC 08/07/24. 2. Small subchorionic hemorrhage. Electronically signed by: Norman Gatlin MD 03/28/2024 11:57 PM EDT RP Workstation: [id number]    MAU COURSE CBC, Quant, ABO/Rh, ultrasound, wet prep and GC/chlamydia culture, UA  MDM Bleeding in early pregnancy with normal intrauterine pregnancy and hemodynamically stable. Pt reports no longer  bleeding. Blood noted in urine but no other evidence of UTI. May have been contaminated. Will send urine for culture. Normal live SIUP w/ small [facility] which if likely the source of bleeding. No evidence of SAB, ectopic pregnancy or other emergent condition.   ASSESSMENT 1. Vaginal bleeding in pregnancy, first trimester   2. Subchorionic hemorrhage in first trimester   3. Normal IUP (intrauterine pregnancy) on prenatal ultrasound, first trimester   4. [redacted] weeks gestation of pregnancy     PLAN Discharge home in stable condition. Bleeding  precautions  Follow-up Information     [facility] Follow up on 04/20/2024.   Specialty: Obstetrics and Gynecology Why: Start prenatal care Contact information: [address] [place]  [address] [phone]        [facility] Follow up.   Specialty: Obstetrics and Gynecology Why: As needed in emergencies Contact information: [address] [place]  [phone]               Allergies as of 03/29/2024   No Known Allergies      Medication List     TAKE these medications    prenatal multivitamin Tabs tablet Take 1 tablet by mouth daily at 12 noon.         Claudene, Anthonella Klausner , CNM 03/29/2024  1:22 AM  4

## 2024-03-29 DIAGNOSIS — Z3A01 Less than 8 weeks gestation of pregnancy: Secondary | ICD-10-CM

## 2024-03-29 DIAGNOSIS — O209 Hemorrhage in early pregnancy, unspecified: Secondary | ICD-10-CM

## 2024-03-29 LAB — GC/CHLAMYDIA PROBE AMP (~~LOC~~) NOT AT ARMC
Chlamydia: NEGATIVE
Comment: NEGATIVE
Comment: NORMAL
Neisseria Gonorrhea: NEGATIVE

## 2024-03-29 LAB — HCG, QUANTITATIVE, PREGNANCY: hCG, Beta Chain, Quant, S: 184562 m[IU]/mL — ABNORMAL HIGH (ref <5)

## 2024-03-29 LAB — RPR: RPR Ser Ql: NONREACTIVE

## 2024-03-29 NOTE — Discharge Instructions (Signed)
  CenteringPregnancy is a model of prenatal care that started 30 years ago and is used in about 600 practices around the [place]. You meet with a group of 8-12 women due around the same time as you. In Centering you will have individual time with the provider and meet as a group. There's much more time for discussion and learning. You will actually have much more time with your provider in Centering than in traditional prenatal care.? You will come directly into the Centering room and will not wait in the lobby so there is no wasted time. You will have 2-hour visits every 4 weeks then every 2 weeks. You will know your Centering prenatal appointments in advance. In your last month of pregnancy, you may also come in for some individual visits. Additional appointments can be scheduled if you need more care. Studies have shown that CenteringPregnancy improves birth outcomes. We have seen especially big improvements in fewer Black women delivering babies who are too small or born too early. Visit the website CenteringHealthcare for more information. Let your provider or clinic staff know if you want to sign up or email [email or web address] for more information.   CenteringPregnancy Video

## 2024-03-31 LAB — CULTURE, OB URINE: Culture: 50000 — AB

## 2024-04-01 ENCOUNTER — Ambulatory Visit: Payer: Self-pay | Admitting: Advanced Practice Midwife

## 2024-04-20 ENCOUNTER — Telehealth

## 2024-04-20 DIAGNOSIS — O219 Vomiting of pregnancy, unspecified: Secondary | ICD-10-CM

## 2024-04-20 DIAGNOSIS — Z3490 Encounter for supervision of normal pregnancy, unspecified, unspecified trimester: Secondary | ICD-10-CM | POA: Insufficient documentation

## 2024-04-20 DIAGNOSIS — Z3A11 11 weeks gestation of pregnancy: Secondary | ICD-10-CM

## 2024-04-20 DIAGNOSIS — O0990 Supervision of high risk pregnancy, unspecified, unspecified trimester: Secondary | ICD-10-CM | POA: Insufficient documentation

## 2024-04-20 MED ORDER — GOJJI WEIGHT SCALE MISC: 1.0000 | Freq: Once | 0 refills | Status: AC

## 2024-04-20 MED ORDER — DOXYLAMINE-PYRIDOXINE 10-10 MG PO TBEC: 2.0000 | DELAYED_RELEASE_TABLET | Freq: Every day | ORAL | 2 refills | Status: AC

## 2024-04-20 MED ORDER — BLOOD PRESSURE KIT DEVI: 1.0000 | Freq: Once | 0 refills | Status: AC

## 2024-04-20 NOTE — Progress Notes (Signed)
 New OB Intake  I connected with Deborah Craig  on 04/20/24 at 10:15 AM EST by MyChart Video Visit and verified that I am speaking with the correct person using two identifiers. Nurse is located at [facility] and pt is located at home.  I discussed the limitations, risks, security and privacy concerns of performing an evaluation and management service by telephone and the availability of in person appointments. I also discussed with the patient that there may be a patient responsible charge related to this service. The patient expressed understanding and agreed to proceed.  I explained I am completing New OB Intake today. We discussed EDD of 11/09/24 by US  at [redacted]w[redacted]d. Pt is [id number]. I reviewed her allergies, medications and Medical/Surgical/OB history.    Patient Active Problem List   Diagnosis Date Noted   Supervision of low-risk pregnancy 04/20/2024   Uterine leiomyoma 08/05/2019    Concerns addressed today Nausea and vomiting: Nausea present most days. Some days she vomits up to 2x, which is usually food aversion related. Diclegis offered and ordered. Patient to send a message if no improvement.  Vaginal spotting: Ongoing since visit to MAU/ Reviewed presence of small subchorionic hemorrhage may cause spotting. Reviewed return precautions to MAU including any heavier vaginal bleeding.   Delivery Plans Plans to deliver at [facility]. Patient is unsure about waterbirth.  MyChart/Babyscripts MyChart access verified. I explained pt will have some visits in office and some virtually. Babyscripts instructions given and order placed.   Blood Pressure Cuff/Weight Scale Blood pressure cuff ordered for patient to pick-up from [employer]. Explained after first prenatal appt pt will check weekly and document in Babyscripts. Patient does not have weight scale; order sent to [facility], patient may track weight weekly in Babyscripts.  Anatomy US  Explained anatomy US  will be around 19 weeks. Message sent  to MFM to schedule.  Is patient a CenteringPregnancy candidate?  Declined Declined due to Group setting  Is patient a Mom+Baby Combined Care candidate?  Not a candidate    Is patient a candidate for Babyscripts Optimization? Yes, patient accepted   First visit review I reviewed new OB appt with patient. Explained pt will be seen by Olam Boards, CNM at first visit. Discussed [facility] genetic screening with patient; desires Panorama and Horizon. Will need routine prenatal labs and Pap smear.   Last Pap Diagnosis  Date Value Ref Range Status  08/05/2019   Final   - Negative for Intraepithelial Lesions or Malignancy (NILM)  08/05/2019 - Benign reactive/reparative changes  Final   Vernell FORBES Ruddle, RN 04/20/2024  10:41 AM

## 2024-04-20 NOTE — Patient Instructions (Signed)
 [facility] [address], [place], [place] [address] [phone] Hours: Sunday Closed Monday 9AM-6PM Tuesday 9AM-6PM Wednesday 9AM-6PM Thursday 9AM-6PM Friday           9AM-6PM Saturday         10 AM-1PM

## 2024-04-27 ENCOUNTER — Ambulatory Visit (INDEPENDENT_AMBULATORY_CARE_PROVIDER_SITE_OTHER): Payer: Self-pay | Admitting: Advanced Practice Midwife

## 2024-04-27 ENCOUNTER — Encounter: Payer: Self-pay | Admitting: Advanced Practice Midwife

## 2024-04-27 ENCOUNTER — Other Ambulatory Visit: Payer: Self-pay

## 2024-04-27 ENCOUNTER — Other Ambulatory Visit (HOSPITAL_COMMUNITY)
Admission: RE | Admit: 2024-04-27 | Discharge: 2024-04-27 | Disposition: A | Source: Ambulatory Visit | Attending: Advanced Practice Midwife | Admitting: Advanced Practice Midwife

## 2024-04-27 VITALS — BP 127/85 | HR 88 | Wt 160.8 lb

## 2024-04-27 DIAGNOSIS — Z3A12 12 weeks gestation of pregnancy: Secondary | ICD-10-CM | POA: Insufficient documentation

## 2024-04-27 DIAGNOSIS — Z23 Encounter for immunization: Secondary | ICD-10-CM | POA: Diagnosis not present

## 2024-04-27 DIAGNOSIS — Z8751 Personal history of pre-term labor: Secondary | ICD-10-CM | POA: Diagnosis not present

## 2024-04-27 DIAGNOSIS — Z3490 Encounter for supervision of normal pregnancy, unspecified, unspecified trimester: Secondary | ICD-10-CM | POA: Diagnosis not present

## 2024-04-27 DIAGNOSIS — O0990 Supervision of high risk pregnancy, unspecified, unspecified trimester: Secondary | ICD-10-CM

## 2024-04-27 DIAGNOSIS — O0991 Supervision of high risk pregnancy, unspecified, first trimester: Secondary | ICD-10-CM

## 2024-04-27 NOTE — Progress Notes (Signed)
 Subjective:   Deborah Craig is a 33 y.o. H6E9888 at [redacted]w[redacted]d by early ultrasound being seen today for her first obstetrical visit.  Her obstetrical history is significant for hx preterm birth and has Uterine leiomyoma; Supervision of low-risk pregnancy; and History of preterm delivery on their problem list.. Patient does intend to breast feed. Pregnancy history fully reviewed.  Patient reports nausea.  HISTORY: OB History  Gravida Para Term Preterm AB Living  3 1 0 1 1 1   SAB IAB Ectopic Multiple Live Births  0 1 0 0 1    # Outcome Date GA Lbr Len/2nd Weight Sex Type Anes PTL Lv  3 Current           2 Preterm 09/14/11 [redacted]w[redacted]d 18:44 / 00:18 6 lb 8.2 oz (2.954 kg) F Vag-Spont None  LIV     Birth Comments: wnl     Name: Deborah Craig     Apgar1: 9  Apgar5: 9  1 IAB 2013           Past Medical History:  Diagnosis Date   Abnormal uterine bleeding (AUB) 07/11/2019   Anemia 07/11/2019   SYMPTOMATIC   History of blood transfusion    8 months ago for anemia per pt on 03-14-2021   Iron  deficiency anemia 07/11/2019   Thrombocytosis 08/09/2019   [ ]  rpt mid 2021     Likely due to heavy periods     Uterine leiomyoma 08/05/2019   Past Surgical History:  Procedure Laterality Date   DILATATION & CURETTAGE/HYSTEROSCOPY WITH MYOSURE N/A 07/12/2020   Procedure: DILATATION & CURETTAGE/HYSTEROSCOPY;  Surgeon: Izell Harari, MD;  Location: [facility];  Service: Gynecology;  Laterality: N/A;   WISDOM TOOTH EXTRACTION     10 yrs ago   Family History  Problem Relation Age of Onset   Healthy Mother    Healthy Father    Diabetes Brother    Asthma Brother    Diabetes Maternal Grandmother    Hypertension Maternal Grandmother    Social History   Tobacco Use   Smoking status: Never   Smokeless tobacco: Never  Vaping Use   Vaping status: Never Used  Substance Use Topics   Alcohol use: No   Drug use: No   No Known Allergies Current Outpatient Medications on File Prior  to Visit  Medication Sig Dispense Refill   Doxylamine-Pyridoxine (DICLEGIS) 10-10 MG TBEC Take 2 tablets by mouth at bedtime. May add 1 tablet at breakfast and 1 tablet at lunch if needed. 100 tablet 2   Prenatal Vit-Fe Fumarate-FA (PRENATAL MULTIVITAMIN) TABS tablet Take 1 tablet by mouth daily at 12 noon.     No current facility-administered medications on file prior to visit.    Exam   Vitals:   04/27/24 1419  BP: 127/85  Pulse: 88  Weight: 160 lb 12.8 oz (72.9 kg)   Fetal Heart Rate (bpm): 158  Uterus:     Pelvic Exam: Perineum: no hemorrhoids, normal perineum   Vulva: normal external genitalia, no lesions   Vagina:  normal mucosa, normal discharge   Cervix: no lesions and normal, pap smear done.    Adnexa: normal adnexa and no mass, fullness, tenderness   Bony Pelvis: average  System: General: well-developed, well-nourished female in no acute distress   Breast:  normal appearance, no masses or tenderness   Skin: normal coloration and turgor, no rashes   Neurologic: oriented, normal, negative, normal mood   Extremities: normal strength, tone, and muscle  mass, ROM of all joints is normal   HEENT PERRLA, extraocular movement intact and sclera clear, anicteric   Mouth/Teeth mucous membranes moist, pharynx normal without lesions and dental hygiene good   Neck supple and no masses   Cardiovascular: regular rate and rhythm   Respiratory:  no respiratory distress, normal breath sounds   Abdomen: soft, non-tender; bowel sounds normal; no masses,  no organomegaly     Assessment:   Pregnancy: [id number] Patient Active Problem List   Diagnosis Date Noted   History of preterm delivery 04/27/2024   Supervision of low-risk pregnancy 04/20/2024   Uterine leiomyoma 08/05/2019     Plan:  1. Supervision of high risk pregnancy, antepartum (Primary) --Anticipatory guidance about next visits/weeks of pregnancy given.  --Anatomy US  06/18/24 --Babyscripts Opt schedule, next visit at 20  weeks  - Culture, OB Urine - CBC/D/Plt+RPR+Rh+ABO+RubIgG... - Hemoglobin A1c - PANORAMA PRENATAL TEST - [facility] Basic Panel - Cytology - PAP( [place]) - Flu vaccine trivalent PF, 6mos and older(Flulaval,Afluria,Fluarix,Fluzone)  2. [redacted] weeks gestation of pregnancy  - Culture, OB Urine - CBC/D/Plt+RPR+Rh+ABO+RubIgG... - Hemoglobin A1c - PANORAMA PRENATAL TEST - [facility] Basic Panel - Cytology - PAP( [place])  3. History of preterm delivery --At 36 weeks     Initial labs drawn. Continue prenatal vitamins. Discussed and offered genetic screening options, including Quad screen/AFP, NIPS testing, and option to decline testing. Benefits/risks/alternatives reviewed. Pt aware that anatomy US  is form of genetic screening with lower accuracy in detecting trisomies than blood work.  Pt chooses genetic screening today. NIPS: ordered. Ultrasound discussed; fetal anatomic survey: ordered. Problem list reviewed and updated. The nature of [facility] with multiple MDs and other Advanced Practice Providers was explained to patient; also emphasized that residents, students are part of our team. Routine obstetric precautions reviewed. Return in about 6 weeks (around 06/08/2024).   Olam Boards, CNM 04/27/24 3:44 PM

## 2024-04-28 LAB — CBC/D/PLT+RPR+RH+ABO+RUBIGG...
Antibody Screen: NEGATIVE
Basophils Absolute: 0 x10E3/uL (ref 0.0–0.2)
Basos: 0 %
EOS (ABSOLUTE): 0.1 x10E3/uL (ref 0.0–0.4)
Eos: 1 %
HCV Ab: NONREACTIVE
HIV Screen 4th Generation wRfx: NONREACTIVE
Hematocrit: 40.8 % (ref 34.0–46.6)
Hemoglobin: 13.6 g/dL (ref 11.1–15.9)
Hepatitis B Surface Ag: NEGATIVE
Immature Grans (Abs): 0 x10E3/uL (ref 0.0–0.1)
Immature Granulocytes: 0 %
Lymphocytes Absolute: 1.6 x10E3/uL (ref 0.7–3.1)
Lymphs: 18 %
MCH: 29.9 pg (ref 26.6–33.0)
MCHC: 33.3 g/dL (ref 31.5–35.7)
MCV: 90 fL (ref 79–97)
Monocytes Absolute: 1 x10E3/uL — ABNORMAL HIGH (ref 0.1–0.9)
Monocytes: 11 %
Neutrophils Absolute: 6.3 x10E3/uL (ref 1.4–7.0)
Neutrophils: 70 %
Platelets: 382 x10E3/uL (ref 150–450)
RBC: 4.55 x10E6/uL (ref 3.77–5.28)
RDW: 11.8 % (ref 11.7–15.4)
RPR Ser Ql: NONREACTIVE
Rh Factor: POSITIVE
Rubella Antibodies, IGG: 2.68 {index} (ref >0.99)
WBC: 9 x10E3/uL (ref 3.4–10.8)

## 2024-04-28 LAB — HEMOGLOBIN A1C
Est. average glucose Bld gHb Est-mCnc: 97 mg/dL
Hgb A1c MFr Bld: 5 % (ref 4.8–5.6)

## 2024-04-28 LAB — HCV INTERPRETATION

## 2024-05-01 LAB — URINE CULTURE, OB REFLEX

## 2024-05-01 LAB — CULTURE, OB URINE

## 2024-05-02 ENCOUNTER — Ambulatory Visit: Payer: Self-pay | Admitting: Advanced Practice Midwife

## 2024-05-02 DIAGNOSIS — O2342 Unspecified infection of urinary tract in pregnancy, second trimester: Secondary | ICD-10-CM

## 2024-05-02 MED ORDER — NITROFURANTOIN MONOHYD MACRO 100 MG PO CAPS: 100.0000 mg | ORAL_CAPSULE | Freq: Two times a day (BID) | ORAL | 0 refills | Status: AC

## 2024-05-03 LAB — CYTOLOGY - PAP
Chlamydia: NEGATIVE
Comment: NEGATIVE
Comment: NEGATIVE
Comment: NORMAL
Diagnosis: NEGATIVE
High risk HPV: NEGATIVE
Neisseria Gonorrhea: NEGATIVE

## 2024-05-04 LAB — PANORAMA PRENATAL TEST FULL PANEL:PANORAMA TEST PLUS 5 ADDITIONAL MICRODELETIONS: FETAL FRACTION: 12

## 2024-05-06 LAB — HORIZON CUSTOM: REPORT SUMMARY: NEGATIVE

## 2024-05-08 ENCOUNTER — Inpatient Hospital Stay (HOSPITAL_COMMUNITY)

## 2024-05-08 ENCOUNTER — Inpatient Hospital Stay (HOSPITAL_COMMUNITY)
Admission: AD | Admit: 2024-05-08 | Discharge: 2024-05-08 | Disposition: A | Discharge status: Home / Self Care | Attending: Obstetrics and Gynecology | Admitting: Obstetrics and Gynecology

## 2024-05-08 ENCOUNTER — Other Ambulatory Visit: Payer: Self-pay

## 2024-05-08 DIAGNOSIS — O209 Hemorrhage in early pregnancy, unspecified: Secondary | ICD-10-CM | POA: Diagnosis not present

## 2024-05-08 DIAGNOSIS — O4692 Antepartum hemorrhage, unspecified, second trimester: Secondary | ICD-10-CM | POA: Diagnosis not present

## 2024-05-08 DIAGNOSIS — Z3A14 14 weeks gestation of pregnancy: Secondary | ICD-10-CM | POA: Diagnosis not present

## 2024-05-08 DIAGNOSIS — O208 Other hemorrhage in early pregnancy: Secondary | ICD-10-CM | POA: Diagnosis present

## 2024-05-08 DIAGNOSIS — N390 Urinary tract infection, site not specified: Secondary | ICD-10-CM | POA: Diagnosis not present

## 2024-05-08 DIAGNOSIS — O418X20 Other specified disorders of amniotic fluid and membranes, second trimester, not applicable or unspecified: Secondary | ICD-10-CM

## 2024-05-08 DIAGNOSIS — O2342 Unspecified infection of urinary tract in pregnancy, second trimester: Secondary | ICD-10-CM | POA: Insufficient documentation

## 2024-05-08 LAB — WET PREP, GENITAL
Clue Cells Wet Prep HPF POC: NONE SEEN
Sperm: NONE SEEN
Trich, Wet Prep: NONE SEEN
WBC, Wet Prep HPF POC: <10 (ref <10)
Yeast Wet Prep HPF POC: NONE SEEN

## 2024-05-08 LAB — URINALYSIS, MICROSCOPIC (REFLEX): Bacteria, UA: NONE SEEN

## 2024-05-08 LAB — URINALYSIS, ROUTINE W REFLEX MICROSCOPIC
Bilirubin Urine: NEGATIVE
Glucose, UA: NEGATIVE mg/dL
Ketones, ur: 15 mg/dL — AB
Leukocytes,Ua: NEGATIVE
Nitrite: NEGATIVE
Protein, ur: NEGATIVE mg/dL
Specific Gravity, Urine: 1.01 (ref 1.005–1.030)
pH: 6 (ref 5.0–8.0)

## 2024-05-08 NOTE — MAU Note (Cosign Needed)
 Maternal Assessment Unit Provider Note  Subjective: [name] is a 33 y.o. [phone] pregnant female at [redacted]w[redacted]d who presents to MAU today with complaint of vaginal bleeding.  She noticed some scant blood in her underwear and on tissue paper with wiping. No recent intercourse. No pain/cramping. No leaking fluid or change in discharge. She is currently being treated with nitrofurantoin  for UTI.   She reports a prior episode of vaginal spotting in Oct and was diagnosed with a small subchorionic hemorrhage at that time.  Receives care at [facility]. Prenatal records reviewed.  Pertinent items noted in HPI and remainder of comprehensive ROS otherwise negative.   Objective: BP 123/84 (BP Location: Right Arm)   Pulse 90   Temp 99 F (37.2 C) (Oral)   Resp 16   Ht 5' 1 (1.549 m)   Wt 72.2 kg   LMP 02/03/2024 (Within Weeks)   SpO2 100%   BMI 30.08 kg/m  Physical Exam Vitals reviewed.  Constitutional:      General: She is not in acute distress.    Appearance: She is well-developed. She is not diaphoretic.  HENT:     Head: Normocephalic and atraumatic.  Eyes:     General: No scleral icterus. Pulmonary:     Effort: Pulmonary effort is normal. No respiratory distress.  Skin:    General: Skin is warm and dry.  Neurological:     General: No focal deficit present.     Mental Status: She is alert.    Results for orders placed or performed during the hospital encounter of 05/08/24 (from the past 24 hours)  Urinalysis, Routine w reflex microscopic -Urine, Clean Catch     Status: Abnormal   Collection Time: 05/08/24  9:45 PM  Result Value Ref Range   Color, Urine YELLOW YELLOW   APPearance CLEAR CLEAR   Specific Gravity, Urine 1.010 1.005 - 1.030   pH 6.0 5.0 - 8.0   Glucose, UA NEGATIVE NEGATIVE mg/dL   Hgb urine dipstick LARGE (A) NEGATIVE   Bilirubin Urine NEGATIVE NEGATIVE   Ketones, ur 15 (A) NEGATIVE mg/dL   Protein, ur NEGATIVE NEGATIVE mg/dL   Nitrite NEGATIVE NEGATIVE    Leukocytes,Ua NEGATIVE NEGATIVE  Wet prep, genital     Status: None   Collection Time: 05/08/24  9:45 PM   Specimen: PATH Cytology Cervicovaginal Ancillary Only  Result Value Ref Range   Yeast Wet Prep HPF POC NONE SEEN NONE SEEN   Trich, Wet Prep NONE SEEN NONE SEEN   Clue Cells Wet Prep HPF POC NONE SEEN NONE SEEN   WBC, Wet Prep HPF POC <10 <10   Sperm NONE SEEN   Urinalysis, Microscopic (reflex)     Status: None   Collection Time: 05/08/24  9:45 PM  Result Value Ref Range   RBC / HPF 0-5 0 - 5 RBC/hpf   WBC, UA 0-5 0 - 5 WBC/hpf   Bacteria, UA NONE SEEN NONE SEEN   Squamous Epithelial / HPF 0-5 0 - 5 /HPF   Mucus PRESENT      Fetal Evaluation    Num Of Fetuses:         1  Fetal Heart Rate(bpm):  149  Cardiac Activity:       Observed  Presentation:           Breech  Placenta:               Anterior    Amniotic Fluid  AFI FV:  Within normal limits                                Largest Pocket(cm)                              4.8 ---------------------------------------------------------------------- OB History    Gravidity:    3         Term:   0        Prem:   1        SAB:   0  TOP:          1       Ectopic:  0        Living: 1 ---------------------------------------------------------------------- Gestational Age    Best:          14w 2d     Det. By:  Early Ultrasound         EDD:   11/04/24                                      (03/28/24) ---------------------------------------------------------------------- Anatomy    Stomach:               Appears normal, left   Bladder:                Appears normal                         sided ---------------------------------------------------------------------- Cervix Uterus Adnexa  Cervix  Length:            3.5  cm.  Normal appearance by transabdominal scan    Uterus  Single fibroid noted, see table below.    Right Ovary  Size(cm)     3.05   x   1.24   x  1.27      Vol(ml): 2.51  Within normal limits.     Left Ovary  Size(cm)     3.55   x   1.56   x  1.44      Vol(ml): 4.18  Within normal limits.    Adnexa  No abnormality visualized ---------------------------------------------------------------------- Myomas    Site                     L(cm)      W(cm)      D(cm)       Location  Right Fundal             2.52       1.99       1.85        Intramural ----------------------------------------------------------------------    Blood Flow                  RI       PI       Comments   ---------------------------------------------------------------------- Recommendations    Patient presented to the MAU with vaginal bleeding.  A limited ultrasound study was performed.  Normal amniotic  fluid.  Good fetal heart activity is seen. Placenta is anterior.  An echogenic area measuring 5.8 x 2.5 cm on the right side  (close to superior edge of the placenta) and this could be a  subchorionic hematoma. No evidence of low lying placenta.  A small fundal  myoma is seen (measurements above). ----------------------------------------------------------------------                  Fredia Fresh, MD Electronically Signed Final Report   05/08/2024 10:48 pm   MDM: Moderate risk  MAU Course: Vaginal swabs, UA, limited OB US  ordered  UA positive for blood--likely vaginal bleeding contamination. Already being treated for UTI. Otherwise wet prep negative aside from pending GC.   US  demonstrates 5.8 x 2.5cm subchorionic hematoma. This likely is an increase in side from prior and likely cause of vaginal bleeding. Discussed expectations with patient, that she may continue to have spotting. Return to the MAU for more significant bleeding. Discussed rarely these can expand further and threaten the pregnancy. Recommend pelvic rest until next OB appt and cleared by provider.  Questions were answered to the satisfaction of the patient and/or family prior to discharge.   Assessment Medical screening exam complete     ICD-10-CM   1. Vaginal bleeding affecting early pregnancy  O20.9     2. Subchorionic hematoma in second trimester, single or unspecified fetus  O41.8X20    O46.8X2      Plan Education provided regarding subchorionic hemorrhage and expectations Pelvic Rest Finish Nitrofurantoin  course for previously diagnosed UTI.  Discharge from MAU in stable condition with strict return/ER precautions Follow up at [facility] as scheduled for ongoing prenatal care  Allergies as of 05/08/2024   No Known Allergies      Medication List     TAKE these medications    Doxylamine -Pyridoxine  10-10 MG Tbec Commonly known as: Diclegis  Take 2 tablets by mouth at bedtime. May add 1 tablet at breakfast and 1 tablet at lunch if needed.   nitrofurantoin  (macrocrystal-monohydrate) 100 MG capsule Commonly known as: MACROBID  Take 1 capsule (100 mg total) by mouth 2 (two) times daily.   prenatal multivitamin Tabs tablet Take 1 tablet by mouth daily at 12 noon.        Trudy Leeroy NOVAK, MD 05/08/2024 10:58 PM

## 2024-05-08 NOTE — MAU Note (Signed)
 Deborah Craig is a 33 y.o. at [redacted]w[redacted]d here in MAU reporting: went to the restroom and noticed scant amount blood on underwear - on tissue paper when wiping as well.  Denies pain. No recent intercourse.   Onset of complaint: 2124 Pain score: 0 Vitals:   05/08/24 2135  BP: 123/84  Pulse: 90  Resp: 16  Temp: 99 F (37.2 C)  SpO2: 100%     FHT: 152  Lab orders placed from triage: UA

## 2024-05-10 ENCOUNTER — Ambulatory Visit: Payer: Self-pay

## 2024-05-10 LAB — GC/CHLAMYDIA PROBE AMP (~~LOC~~) NOT AT ARMC
Chlamydia: NEGATIVE
Comment: NEGATIVE
Comment: NORMAL
Neisseria Gonorrhea: NEGATIVE

## 2024-05-10 LAB — CULTURE, OB URINE: Culture: NO GROWTH

## 2024-06-07 DIAGNOSIS — O99210 Obesity complicating pregnancy, unspecified trimester: Secondary | ICD-10-CM | POA: Insufficient documentation

## 2024-06-08 ENCOUNTER — Encounter: Payer: Self-pay | Admitting: Advanced Practice Midwife

## 2024-06-08 ENCOUNTER — Ambulatory Visit (INDEPENDENT_AMBULATORY_CARE_PROVIDER_SITE_OTHER): Admitting: Advanced Practice Midwife

## 2024-06-08 ENCOUNTER — Other Ambulatory Visit: Payer: Self-pay

## 2024-06-08 VITALS — BP 118/82 | HR 88 | Wt 159.5 lb

## 2024-06-08 DIAGNOSIS — Z3A18 18 weeks gestation of pregnancy: Secondary | ICD-10-CM | POA: Diagnosis not present

## 2024-06-08 DIAGNOSIS — O0992 Supervision of high risk pregnancy, unspecified, second trimester: Secondary | ICD-10-CM | POA: Diagnosis not present

## 2024-06-08 DIAGNOSIS — O09892 Supervision of other high risk pregnancies, second trimester: Secondary | ICD-10-CM | POA: Diagnosis not present

## 2024-06-08 DIAGNOSIS — O09893 Supervision of other high risk pregnancies, third trimester: Secondary | ICD-10-CM

## 2024-06-08 NOTE — Progress Notes (Signed)
 "  PRENATAL VISIT NOTE  Subjective:  Deborah Craig is a 33 y.o. [id number] at [redacted]w[redacted]d being seen today for ongoing prenatal care.  She is currently monitored for the following issues for this low-risk pregnancy and has Uterine leiomyoma; Supervision of high risk pregnancy, antepartum; History of preterm delivery; and Obesity affecting pregnancy, antepartum on their problem list.  Patient reports no complaints.  Contractions: Not present. Vag. Bleeding: None.  Movement: Present. Denies leaking of fluid.   The following portions of the patient's history were reviewed and updated as appropriate: allergies, current medications, past family history, past medical history, past social history, past surgical history and problem list.   Objective:   Vitals:   06/08/24 1448  BP: 118/82  Pulse: 88  Weight: 159 lb 8 oz (72.3 kg)    Fetal Status:  Fetal Heart Rate (bpm): 148   Movement: Present    General: Alert, oriented and cooperative. Patient is in no acute distress.  Skin: Skin is warm and dry. No rash noted.   Cardiovascular: Normal heart rate noted  Respiratory: Normal respiratory effort, no problems with respiration noted  Abdomen: Soft, gravid, appropriate for gestational age.  Pain/Pressure: Absent     Pelvic: Cervical exam deferred        Extremities: Normal range of motion.  Edema: None  Mental Status: Normal mood and affect. Normal behavior. Normal judgment and thought content.      04/27/2024    3:05 PM 04/27/2021    9:35 AM 03/23/2021    9:17 AM  Depression screen PHQ 2/9  Decreased Interest 2 0 0  Down, Depressed, Hopeless 0 0 0  PHQ - 2 Score 2 0 0  Altered sleeping 0 0 0  Tired, decreased energy 1 0 0  Change in appetite 0 0 0  Feeling bad or failure about yourself  0 0 0  Trouble concentrating 0 0 0  Moving slowly or fidgety/restless 0 0 0  Suicidal thoughts 0 0 0  PHQ-9 Score 3 0  0      Data saved with a previous flowsheet row definition        04/27/2024    3:05  PM 04/27/2021    9:35 AM 03/23/2021    9:17 AM 07/10/2020    1:48 PM  GAD 7 : Generalized Anxiety Score  Nervous, Anxious, on Edge 0 0 0 0  Control/stop worrying 0 0 0 0  Worry too much - different things 0 0 0 0  Trouble relaxing 0 0 0 0  Restless 0 0 0 0  Easily annoyed or irritable 0 0 0 0  Afraid - awful might happen 0 0 0 0  Total GAD 7 Score 0 0 0 0  Anxiety Difficulty    Not difficult at all    Assessment and Plan:  Pregnancy: [id number] at [redacted]w[redacted]d 1. Encounter for supervision of low-risk pregnancy, antepartum (Primary) --Anticipatory guidance about next visits/weeks of pregnancy given.  - AFP, Serum, Open Spina Bifida  2. [redacted] weeks gestation of pregnancy  - AFP, Serum, Open Spina Bifida  3. History of preterm delivery, currently pregnant in third trimester   Preterm labor symptoms and general obstetric precautions including but not limited to vaginal bleeding, contractions, leaking of fluid and fetal movement were reviewed in detail with the patient. Please refer to After Visit Summary for other counseling recommendations.   Return in about 4 weeks (around 07/06/2024) for LOB.  Future Appointments  Date Time Provider Department Center  06/18/2024  7:00 AM [facility] PROVIDER 1 [facility]  06/18/2024  7:30 AM [facility] US2 [facility]  07/06/2024  3:55 PM Milly Olam DELENA EDDY [facility]  08/03/2024  8:40 AM [facility] LAB [facility]  08/03/2024 10:15 AM [facility]AL 2 [facility]  08/31/2024  1:55 PM [facility]AL 2 [facility]    Olam Milly, CNM  "

## 2024-06-09 LAB — AFP, SERUM, OPEN SPINA BIFIDA
AFP MoM: 1.36
AFP Value: 59.4 ng/mL
Gest. Age on Collection Date: 18.5 wk
Maternal Age At EDD: 33.6 a
OSBR Risk 1 IN: 4034
Test Results:: NEGATIVE
Weight: 160 [lb_av]

## 2024-06-18 ENCOUNTER — Other Ambulatory Visit: Payer: Self-pay | Admitting: *Deleted

## 2024-06-18 ENCOUNTER — Ambulatory Visit (HOSPITAL_BASED_OUTPATIENT_CLINIC_OR_DEPARTMENT_OTHER)

## 2024-06-18 ENCOUNTER — Ambulatory Visit: Attending: Obstetrics and Gynecology | Admitting: Obstetrics

## 2024-06-18 VITALS — BP 116/77 | HR 75

## 2024-06-18 DIAGNOSIS — Z363 Encounter for antenatal screening for malformations: Secondary | ICD-10-CM | POA: Insufficient documentation

## 2024-06-18 DIAGNOSIS — Z3490 Encounter for supervision of normal pregnancy, unspecified, unspecified trimester: Secondary | ICD-10-CM | POA: Diagnosis not present

## 2024-06-18 DIAGNOSIS — O468X2 Other antepartum hemorrhage, second trimester: Secondary | ICD-10-CM | POA: Insufficient documentation

## 2024-06-18 DIAGNOSIS — O0990 Supervision of high risk pregnancy, unspecified, unspecified trimester: Secondary | ICD-10-CM

## 2024-06-18 DIAGNOSIS — Z3A20 20 weeks gestation of pregnancy: Secondary | ICD-10-CM | POA: Insufficient documentation

## 2024-06-18 DIAGNOSIS — Z8751 Personal history of pre-term labor: Secondary | ICD-10-CM

## 2024-06-18 DIAGNOSIS — O3412 Maternal care for benign tumor of corpus uteri, second trimester: Secondary | ICD-10-CM | POA: Insufficient documentation

## 2024-06-18 DIAGNOSIS — O99212 Obesity complicating pregnancy, second trimester: Secondary | ICD-10-CM | POA: Insufficient documentation

## 2024-06-18 DIAGNOSIS — O99210 Obesity complicating pregnancy, unspecified trimester: Secondary | ICD-10-CM

## 2024-06-18 DIAGNOSIS — O09212 Supervision of pregnancy with history of pre-term labor, second trimester: Secondary | ICD-10-CM | POA: Insufficient documentation

## 2024-06-18 DIAGNOSIS — E669 Obesity, unspecified: Secondary | ICD-10-CM

## 2024-06-18 DIAGNOSIS — D251 Intramural leiomyoma of uterus: Secondary | ICD-10-CM

## 2024-06-18 NOTE — Progress Notes (Signed)
 MFM Consult Note  Deborah Craig is currently at [redacted]w[redacted]d. She was seen today for a detailed fetal anatomy scan due to maternal obesity.  She denies any significant past medical history and denies any problems in her current pregnancy.    She had a cell free DNA test earlier in her pregnancy which indicated a low risk for trisomy 15, 58, and 13. A female fetus is predicted.   Sonographic findings Single intrauterine pregnancy at 20w 1d. Fetal cardiac activity:  Observed and appears normal. Presentation: Breech. The views of the fetal anatomy were limited today due to the fetal position.  What was visualized today appeared within normal limits. Fetal biometry shows the estimated fetal weight of 0 lb 10 oz, 294 grams (14%). Amniotic fluid: Within normal limits.  MVP: 5.98 cm. Placenta: Anterior. Adnexa: No adnexal mass visualized. Cervical length: 3.83 cm.  A 2 to 3 cm right lateral fibroid was noted on today's exam.  The patient was informed that anomalies may be missed due to technical limitations. If the fetus is in a suboptimal position or maternal habitus is increased, visualization of the fetus in the maternal uterus may be impaired.  Due to the low normal EFW obtained today, a follow-up growth scan was scheduled in 4 weeks to assess the fetal growth and to complete the views of the fetal anatomy.  The patient stated that all of her questions were answered.   A total of 30 minutes was spent counseling, reviewing her chart, and coordinating the care for this patient.  Greater than 50% of the time was spent in direct face-to-face contact.

## 2024-07-06 ENCOUNTER — Encounter: Payer: Self-pay | Admitting: Advanced Practice Midwife

## 2024-07-06 ENCOUNTER — Telehealth: Payer: Self-pay | Admitting: Advanced Practice Midwife

## 2024-07-06 VITALS — BP 117/84 | HR 89 | Wt 160.8 lb

## 2024-07-06 DIAGNOSIS — O09893 Supervision of other high risk pregnancies, third trimester: Secondary | ICD-10-CM

## 2024-07-06 DIAGNOSIS — O0992 Supervision of high risk pregnancy, unspecified, second trimester: Secondary | ICD-10-CM

## 2024-07-06 DIAGNOSIS — Z3A22 22 weeks gestation of pregnancy: Secondary | ICD-10-CM

## 2024-07-06 NOTE — Progress Notes (Signed)
 "  OBSTETRICS PRENATAL VIRTUAL VISIT ENCOUNTER NOTE  Provider location: [facility]   Patient location: Home  I connected with Deborah Craig on 07/06/24 at  3:35 PM EST by MyChart Video Encounter and verified that I am speaking with the correct person using two identifiers. I discussed the limitations, risks, security and privacy concerns of performing an evaluation and management service virtually and the availability of in person appointments. I also discussed with the patient that there may be a patient responsible charge related to this service. The patient expressed understanding and agreed to proceed. Subjective:  Deborah Craig is a 34 y.o. H6E9888 at [redacted]w[redacted]d being seen today for ongoing prenatal care.  She is currently monitored for the following issues for this high-risk pregnancy and has Uterine leiomyoma; Supervision of high risk pregnancy, antepartum; History of preterm delivery; and Obesity affecting pregnancy, antepartum on their problem list.  Patient reports no complaints.  Contractions: Not present. Vag. Bleeding: None.  Movement: Present. Denies any leaking of fluid.   The following portions of the patient's history were reviewed and updated as appropriate: allergies, current medications, past family history, past medical history, past social history, past surgical history and problem list.   Objective:    Vitals:   07/06/24 1623  BP: 117/84  Pulse: 89  Weight: 160 lb 12.8 oz (72.9 kg)    Fetal Status:      Movement: Present    General: Alert, oriented and cooperative. Patient is in no acute distress.  Respiratory: Normal respiratory effort, no problems with respiration noted  Mental Status: Normal mood and affect. Normal behavior. Normal judgment and thought content.  Rest of physical exam deferred due to type of encounter  Imaging: US  MFM OB DETAIL +14 WK Result Date:  06/18/2024 ----------------------------------------------------------------------  OBSTETRICS REPORT                       (Signed Final 06/18/2024 09:27 am) ---------------------------------------------------------------------- Patient Info  ID #:       [id number]                          D.O.B.:  12-11-1990 (33 yrs)(F)  Name:       Deborah Craig                     Visit Date: 06/18/2024 07:24 am ---------------------------------------------------------------------- Performed By  Attending:        Steffan Keys MD         Referred By:      [facility] MAU/Triage  Performed By:     Erminio Gentry            [facility] ---------------------------------------------------------------------- Orders  #  Description                           Code        Ordered By  1  US  MFM OB DETAIL +14 WK               76811.01    Ambrose Wile LEFTWICH-                                                       KIRBY ----------------------------------------------------------------------  #  Order #                     Accession #                Episode #  1  [id number]                   [id number]                 [id number] ---------------------------------------------------------------------- Indications  Poor obstetric history: Previous preterm       O09.219  delivery, antepartum ([redacted]w[redacted]d)  Obesity complicating pregnancy, second         O99.212  trimester (BMI 31)  Uterine fibroids                               O34.10  Subchorionic hemorrhage, antepartum            O45.90  Encounter for antenatal screening for          Z36.3  malformations  [redacted] weeks gestation of pregnancy                Z3A.20 ---------------------------------------------------------------------- Fetal Evaluation  Num Of Fetuses:         1  Fetal Heart Rate(bpm):   147  Cardiac Activity:       Observed  Presentation:           Breech  Placenta:               Anterior  P. Cord Insertion:      Visualized  Amniotic Fluid  AFI FV:      Within normal limits                              Largest Pocket(cm)                              5.98 ---------------------------------------------------------------------- Biometry  BPD:        41  mm     G. Age:  18w 3d        2.6  %    CI:        68.31   %  70 - 86                                                          FL/HC:      18.5   %    16.8 - 19.8  HC:      158.6  mm     G. Age:  18w 5d        2.3  %    HC/AC:      1.07        1.09 - 1.39  AC:      147.7  mm     G. Age:  20w 0d         41  %    FL/BPD:     71.7   %  FL:       29.4  mm     G. Age:  19w 0d         11  %    FL/AC:      19.9   %    20 - 24  HUM:      28.7  mm     G. Age:  19w 2d         31  %  CER:      19.2  mm     G. Age:  18w 5d         25  %  NFT:       4.3  mm  LV:        5.1  mm  CM:        5.1  mm  Est. FW:     294  gm    0 lb 10 oz      14  % ---------------------------------------------------------------------- OB History  Gravidity:    3         Term:   0        Prem:   1        SAB:   0  TOP:          1       Ectopic:  0        Living: 1 ---------------------------------------------------------------------- Gestational Age  LMP:           19w 3d        Date:  02/03/24                  EDD:   11/09/24  U/S Today:     19w 0d                                        EDD:   11/12/24  Best:          20w 1d     Det. By:  Early Ultrasound         EDD:   11/04/24                                      (03/28/24) ---------------------------------------------------------------------- Targeted Anatomy  Central Nervous System  Calvarium/Cranial V.:  Appears normal  Cereb./Vermis:          Appears normal  Cavum:                 Appears normal         Cisterna Magna:         Appears normal  Lateral Ventricles:    Appears normal         Midline Falx:           Appears normal   Choroid Plexus:        Appears normal  Spine  Cervical:              Appears normal         Sacral:                 Appears normal  Thoracic:              Appears normal         Shape/Curvature:        Appears normal  Lumbar:                Appears normal  Head/Neck  Lips:                  Appears normal         Profile:                Appears normal  Neck:                  Appears normal         Orbits/Eyes:            Appears normal  Nuchal Fold:           Appears normal         Mandible:               Appears normal  Nasal Bone:            Present                Maxilla:                Appears normal  Thorax  4 Chamber View:        Appears normal         Interventr. Septum:     Appears normal  Cardiac Rhythm:        Normal                 Cardiac Axis:           Normal  Cardiac Situs:         Appears normal         Diaphragm:              Appears normal  Rt Outflow Tract:      Appears normal         3 Vessel View:          Appears normal  Lt Outflow Tract:      Appears normal         3 V Trachea View:       Appears normal  Aortic Arch:           Appears normal         IVC:                    Appears normal  Ductal Arch:  Appears normal         Crossing:               Appears normal  SVC:                   Appears normal  Abdomen  Ventral Wall:          Appears normal         Lt Kidney:              Appears normal  Cord Insertion:        Appears normal         Rt Kidney:              Appears normal  Situs:                 Appears normal         Bladder:                Appears normal  Stomach:               Appears normal  Extremities  Lt Humerus:            Appears normal         Lt Femur:               Appears normal  Rt Humerus:            Appears normal         Rt Femur:               Appears normal  Lt Forearm:            Appears normal         Lt Lower Leg:           Appears normal  Rt Forearm:            Appears normal         Rt Lower Leg:           Appears normal  Lt Hand:               Open hand  nml          Lt Foot:                Nml heel/foot  Rt Hand:               Open hand nml          Rt Foot:                Nml heel/foot  Other  Umbilical Cord:        Normal 3-vessel        Genitalia:              Female-nml  Comment:     Technically difficult due to [facility] ---------------------------------------------------------------------- Cervix Uterus Adnexa  Cervix  Length:           3.83  cm.  Normal appearance by transabdominal scan  Uterus  Single fibroid noted, see table below.  Right Ovary  Not visualized.  Left Ovary  Size(cm)     1.91   x   1.78   x  1.16      Vol(ml): 2.06  Within normal limits.  Cul De Sac  No free fluid seen.  Adnexa  No adnexal mass visualized ---------------------------------------------------------------------- Myomas  Site  L(cm)      W(cm)      D(cm)       Location  Rt                       2.69       2.96       2.73        Intramural ----------------------------------------------------------------------  Blood Flow                  RI       PI       Comments ---------------------------------------------------------------------- Comments  Sonographic findings  Single intrauterine pregnancy at 20w 1d.  Fetal cardiac activity:  Observed and appears normal.  Presentation: Breech.  The views of the fetal anatomy were limited today due to the  fetal position.  What was visualized today appeared within  normal limits.  Fetal biometry shows the estimated fetal weight of 0 lb 10 oz,  294 grams (14%).  Amniotic fluid: Within normal limits.  MVP: 5.98 cm.  Placenta: Anterior.  Adnexa: No adnexal mass visualized.  Cervical length: 3.83 cm.  There are limitations of prenatal ultrasound such as the  inability to detect certain abnormalities due to poor  visualization. Various factors such as fetal position,  gestational age and maternal body habitus may increase the  difficulty in visualizing the fetal anatomy.  Recommendations  - See Epic note for assessment and plan of  care. Any  referring office that does not utilize Epic will recieve a copy of  today's consult note via fax. Please contact our office with  any concerns. ----------------------------------------------------------------------                  Steffan Keys, MD Electronically Signed Final Report   06/18/2024 09:27 am ----------------------------------------------------------------------    Assessment and Plan:  Pregnancy: H6E9888 at [redacted]w[redacted]d 1. Supervision of high risk pregnancy in second trimester (Primary) --Pt reports good fetal movement, denies cramping, LOF, or vaginal bleeding  --Anticipatory guidance about next visits/weeks of pregnancy given.  --Reviewed recent US  results with pt, plan for follow up for growth scheduled  2. History of preterm delivery, currently pregnant in third trimester --At [redacted]w[redacted]d  3. [redacted] weeks gestation of pregnancy   Preterm labor symptoms and general obstetric precautions including but not limited to vaginal bleeding, contractions, leaking of fluid and fetal movement were reviewed in detail with the patient. I discussed the assessment and treatment plan with the patient. The patient was provided an opportunity to ask questions and all were answered. The patient agreed with the plan and demonstrated an understanding of the instructions. The patient was advised to call back or seek an in-person office evaluation/go to MAU at [facility] for any urgent or concerning symptoms. Please refer to After Visit Summary for other counseling recommendations.   I provided 10 minutes of face-to-face time during this encounter.  No follow-ups on file.  Future Appointments  Date Time Provider Department Center  07/16/2024  7:15 AM [facility] PROVIDER 1 [facility]  07/16/2024  7:30 AM [facility] US1 [facility]  08/03/2024  8:40 AM [facility] LAB [facility]  08/03/2024 10:15 AM Izell Harari, MD [facility]  08/31/2024  1:55 PM Wallace Joesph LABOR, PA [facility]    Olam Boards, CNM [facility] for [employer], [facility]  "

## 2024-07-06 NOTE — Addendum Note (Signed)
 Addended by: MILLY PLANAS A on: 07/06/2024 04:54 PM   Modules accepted: Level of Service

## 2024-07-16 ENCOUNTER — Ambulatory Visit: Admitting: Obstetrics and Gynecology

## 2024-07-16 ENCOUNTER — Ambulatory Visit

## 2024-07-16 ENCOUNTER — Other Ambulatory Visit: Payer: Self-pay | Admitting: Obstetrics and Gynecology

## 2024-07-16 VITALS — BP 126/69 | HR 79

## 2024-07-16 DIAGNOSIS — O99210 Obesity complicating pregnancy, unspecified trimester: Secondary | ICD-10-CM

## 2024-07-16 DIAGNOSIS — O365921 Maternal care for other known or suspected poor fetal growth, second trimester, fetus 1: Secondary | ICD-10-CM

## 2024-07-16 DIAGNOSIS — Z8751 Personal history of pre-term labor: Secondary | ICD-10-CM

## 2024-07-16 DIAGNOSIS — Z3A24 24 weeks gestation of pregnancy: Secondary | ICD-10-CM

## 2024-07-16 DIAGNOSIS — O0990 Supervision of high risk pregnancy, unspecified, unspecified trimester: Secondary | ICD-10-CM

## 2024-07-16 DIAGNOSIS — D251 Intramural leiomyoma of uterus: Secondary | ICD-10-CM

## 2024-07-16 DIAGNOSIS — O365920 Maternal care for other known or suspected poor fetal growth, second trimester, not applicable or unspecified: Secondary | ICD-10-CM

## 2024-07-16 NOTE — Progress Notes (Signed)
 Maternal-Fetal Medicine Consultation  Name: Terresa Marlett  MRN: [id number]  GA: H6E9888 [redacted]w[redacted]d   Patient is here for fetal growth assessment. Obstetrical history is significant for a preterm vaginal delivery at [redacted] weeks gestation in 2013 of a female infant weighing 6 pounds and 8 ounces at birth.  Ultrasound Normal amniotic fluid.  The estimated fetal weight is at the 7th percentile and the abdominal circumference measurement at the 12th percentile.  Umbilical artery Doppler showed normal forward diastolic flow.  No obvious fetal structural defects are seen.  Fetal growth restriction I explained the finding of fetal growth restriction that is difficult to differentiate from a constitutionally small for gestational age fetus. Most fetuses are small but not growth restricted.  I discussed the possible causes of fetal growth restriction including placental insufficiency (most common cause), chromosomal anomalies and fetal infections.  Patient did not give history of fever or rashes.  I explained that only amniocentesis will give a definitive result on the fetal karyotype and some genetic conditions (Microarray).  Patient opted not to have amniocentesis. I discussed ultrasound protocol of monitoring fetal growth restriction.  Timing of delivery will be based on future fetal growth assessments and antenatal testing.  Recommendations - Fetal growth assessment and UA Doppler studies in 3 weeks.     Consultation including face-to-face (more than 50%) counseling 25 minutes.

## 2024-08-03 ENCOUNTER — Other Ambulatory Visit: Payer: Self-pay

## 2024-08-03 ENCOUNTER — Encounter: Payer: Self-pay | Admitting: Obstetrics and Gynecology

## 2024-08-06 ENCOUNTER — Ambulatory Visit

## 2024-08-06 ENCOUNTER — Other Ambulatory Visit

## 2024-08-31 ENCOUNTER — Encounter: Payer: Self-pay | Admitting: Family Medicine

## 2024-11-04 ENCOUNTER — Inpatient Hospital Stay (HOSPITAL_COMMUNITY): Admit: 2024-11-04
